# Patient Record
Sex: Female | Born: 1985 | Race: White | Hispanic: Yes | Marital: Married | Smoking: Never smoker
Health system: Southern US, Community
[De-identification: ages and names within clinical notes are randomized; demographics above are authoritative.]

## PROBLEM LIST (undated history)

## (undated) ENCOUNTER — Inpatient Hospital Stay (HOSPITAL_COMMUNITY): Payer: Self-pay

## (undated) DIAGNOSIS — J302 Other seasonal allergic rhinitis: Secondary | ICD-10-CM

## (undated) DIAGNOSIS — T7840XA Allergy, unspecified, initial encounter: Secondary | ICD-10-CM

## (undated) DIAGNOSIS — Z789 Other specified health status: Secondary | ICD-10-CM

## (undated) DIAGNOSIS — O24419 Gestational diabetes mellitus in pregnancy, unspecified control: Secondary | ICD-10-CM

## (undated) HISTORY — DX: Allergy, unspecified, initial encounter: T78.40XA

## (undated) HISTORY — PX: CHOLECYSTECTOMY: SHX55

## (undated) HISTORY — DX: Gestational diabetes mellitus in pregnancy, unspecified control: O24.419

---
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
## 2005-09-11 ENCOUNTER — Ambulatory Visit (HOSPITAL_COMMUNITY): Admission: RE | Admit: 2005-09-11 | Discharge: 2005-09-11 | Payer: Self-pay | Admitting: *Deleted

## 2005-09-12 ENCOUNTER — Inpatient Hospital Stay (HOSPITAL_COMMUNITY): Admission: AD | Admit: 2005-09-12 | Discharge: 2005-09-12 | Payer: Self-pay | Admitting: Family Medicine

## 2005-09-15 ENCOUNTER — Inpatient Hospital Stay (HOSPITAL_COMMUNITY): Admission: AD | Admit: 2005-09-15 | Discharge: 2005-09-15 | Payer: Self-pay | Admitting: Obstetrics & Gynecology

## 2005-09-17 ENCOUNTER — Inpatient Hospital Stay (HOSPITAL_COMMUNITY): Admission: AD | Admit: 2005-09-17 | Discharge: 2005-09-17 | Payer: Self-pay | Admitting: Obstetrics and Gynecology

## 2006-05-05 ENCOUNTER — Inpatient Hospital Stay (HOSPITAL_COMMUNITY): Admission: AD | Admit: 2006-05-05 | Discharge: 2006-05-07 | Payer: Self-pay | Admitting: Obstetrics

## 2009-08-24 ENCOUNTER — Emergency Department (HOSPITAL_COMMUNITY): Admission: EM | Admit: 2009-08-24 | Discharge: 2009-08-25 | Payer: Self-pay | Admitting: Emergency Medicine

## 2009-08-24 ENCOUNTER — Emergency Department (HOSPITAL_COMMUNITY): Admission: EM | Admit: 2009-08-24 | Discharge: 2009-08-24 | Payer: Self-pay | Admitting: Emergency Medicine

## 2009-12-25 ENCOUNTER — Inpatient Hospital Stay (HOSPITAL_COMMUNITY): Admission: AD | Admit: 2009-12-25 | Discharge: 2009-12-25 | Payer: Self-pay | Admitting: Obstetrics and Gynecology

## 2010-11-14 LAB — POCT PREGNANCY, URINE: Preg Test, Ur: NEGATIVE

## 2010-11-14 LAB — URINALYSIS, ROUTINE W REFLEX MICROSCOPIC
Hgb urine dipstick: NEGATIVE
Ketones, ur: NEGATIVE mg/dL
Nitrite: NEGATIVE
Protein, ur: NEGATIVE mg/dL
Specific Gravity, Urine: 1.025 (ref 1.005–1.030)

## 2010-11-14 LAB — CBC
HCT: 39.6 % (ref 36.0–46.0)
Hemoglobin: 13.6 g/dL (ref 12.0–15.0)
MCV: 91.6 fL (ref 78.0–100.0)

## 2011-01-12 NOTE — Op Note (Signed)
Karen Gamble, Karen Gamble                 ACCOUNT NO.:  [id number]   MEDICAL RECORD NO.:  [id number]                   PATIENT TYPE:  INP   LOCATION:  9169                                 FACILITY:  [facility]   PHYSICIAN:  Kathreen Cosier, M.D.           DATE OF BIRTH:  [birth year]   DATE OF PROCEDURE:  08/13/2003  DATE OF DISCHARGE:                                 OPERATIVE REPORT   The patient is a 25 year old primigravida, [facility] August 08, 2003, was  admitted in labor.  She became fully dilated and pushed up +3 station and  had variables with each contraction.  Eventually she had a prolonged  deceleration, but the fetal heart stayed at 90, and she at +3 station.  A  midline episiotomy which was cut to a third-degree and the vacuum applied.  The vacuum was applied through two contractions.  There was no pop-off, and  there was release of pressure in between the contractions.  She delivered  from the LOA position of a female, Apgars 7 and 8.  There was a tight, short  nuchal cord which was cut prior to delivery.  The placenta was spontaneous.  The third-degree extended to a small fourth-degree, and the rectum was  repaired in two layers, and the anal sphincter appeared, and the episiotomy  repaired in the usual manner.  Postrepair, the rectum and anal sphincter  were intact.  The patient tolerated the procedure well.                                               Kathreen Cosier, M.D.    BAM/MEDQ  D:  08/13/2003  T:  08/15/2003  Job:  [id number]

## 2011-04-06 ENCOUNTER — Inpatient Hospital Stay (HOSPITAL_COMMUNITY)
Admission: AD | Admit: 2011-04-06 | Discharge: 2011-04-06 | Disposition: A | Discharge status: Home / Self Care | Payer: Self-pay | Source: Ambulatory Visit | Attending: Obstetrics | Admitting: Obstetrics

## 2011-04-06 ENCOUNTER — Encounter (HOSPITAL_COMMUNITY): Payer: Self-pay | Admitting: *Deleted

## 2011-04-06 DIAGNOSIS — N946 Dysmenorrhea, unspecified: Secondary | ICD-10-CM

## 2011-04-06 HISTORY — DX: Other specified health status: Z78.9

## 2011-04-06 LAB — CBC
HCT: 41 % (ref 36.0–46.0)
Hemoglobin: 13.9 g/dL (ref 12.0–15.0)
MCH: 30.9 pg (ref 26.0–34.0)
MCHC: 33.9 g/dL (ref 30.0–36.0)
MCV: 91.1 fL (ref 78.0–100.0)
RDW: 12.9 % (ref 11.5–15.5)
WBC: 7.1 10*3/uL (ref 4.0–10.5)

## 2011-04-06 LAB — URINALYSIS, ROUTINE W REFLEX MICROSCOPIC
Bilirubin Urine: NEGATIVE
Glucose, UA: NEGATIVE mg/dL
Protein, ur: NEGATIVE mg/dL
Specific Gravity, Urine: 1.01 (ref 1.005–1.030)
Urobilinogen, UA: 0.2 mg/dL (ref 0.0–1.0)
pH: 5.5 (ref 5.0–8.0)

## 2011-04-06 LAB — WET PREP, GENITAL: Yeast Wet Prep HPF POC: NONE SEEN

## 2011-04-06 LAB — URINE MICROSCOPIC-ADD ON

## 2011-04-06 MED ORDER — IBUPROFEN 800 MG PO TABS: 800.0000 mg | ORAL_TABLET | Freq: Three times a day (TID) | ORAL | Status: AC

## 2011-04-06 NOTE — Progress Notes (Signed)
E.Rice discussed POC. Pt verbalized understanding. Interputor present

## 2011-04-06 NOTE — Progress Notes (Signed)
04/06/2011 Karen Gamble  Interpreter  I assisted Olegario Messier RN and [place] PA  with questions and explanation of plan of care.

## 2011-04-06 NOTE — Progress Notes (Signed)
RLQ pain x 3 days, both pos and neg home UPT Some frequency of urination, no pain

## 2011-04-06 NOTE — ED Provider Notes (Addendum)
History   Pt presents today c/o lower abd pain for the past 3 days. She states she has been trying to get pregnant. She is also c/o vag bleeding.Her LMP was last month around the 7th. She denies fever or any other sx at this time. The interpreter is present.  Chief Complaint  Patient presents with  . Possible Pregnancy  . Vaginal Bleeding   HPI  OB History    Grav Para Term Preterm Abortions TAB SAB Ect Mult Living   2 2 2  0 0 0 0 0 0 2      Past Medical History  Diagnosis Date  . No pertinent past medical history     Past Surgical History  Procedure Date  . Cholecystectomy     No family history on file.  History  Substance Use Topics  . Smoking status: Never Smoker   . Smokeless tobacco: Not on file  . Alcohol Use: No    Allergies: No Known Allergies  No prescriptions prior to admission    Review of Systems  Constitutional: Negative for fever.  Cardiovascular: Negative for chest pain.  Gastrointestinal: Positive for abdominal pain. Negative for nausea, vomiting, diarrhea and constipation.  Genitourinary: Negative for dysuria, urgency, frequency and hematuria.  Neurological: Negative for dizziness and headaches.  Psychiatric/Behavioral: Negative for depression and suicidal ideas.   Physical Exam   Blood pressure 121/77, pulse 61, temperature 98.2 F (36.8 C), resp. rate 18, height 6' (1.829 m), last menstrual period 03/03/2011.  Physical Exam  Constitutional: She is oriented to person, place, and time. She appears well-developed and well-nourished. No distress.  HENT:  Head: Normocephalic and atraumatic.  Eyes: EOM are normal. Pupils are equal, round, and reactive to light.  GI: Soft. She exhibits no distension. There is tenderness. There is no rebound and no guarding.  Genitourinary: There is bleeding around the vagina. No vaginal discharge found.       Uterus NL size and shape. No adnexal masses.  Neurological: She is alert and oriented to person, place,  and time.  Skin: Skin is warm and dry. She is not diaphoretic.  Psychiatric: She has a normal mood and affect. Her behavior is normal. Judgment and thought content normal.    MAU Course  Procedures  Results for orders placed during the hospital encounter of 04/06/11 (from the past 24 hour(s))  URINALYSIS, ROUTINE W REFLEX MICROSCOPIC     Status: Abnormal   Collection Time   04/06/11 12:50 PM      Component Value Range   Color, Urine YELLOW  YELLOW    Appearance HAZY (*) CLEAR    Specific Gravity, Urine 1.010  1.005 - 1.030    pH 5.5  5.0 - 8.0    Glucose, UA NEGATIVE  NEGATIVE (mg/dL)   Hgb urine dipstick LARGE (*) NEGATIVE    Bilirubin Urine NEGATIVE  NEGATIVE    Ketones, ur NEGATIVE  NEGATIVE (mg/dL)   Protein, ur NEGATIVE  NEGATIVE (mg/dL)   Urobilinogen, UA 0.2  0.0 - 1.0 (mg/dL)   Nitrite NEGATIVE  NEGATIVE    Leukocytes, UA NEGATIVE  NEGATIVE   URINE MICROSCOPIC-ADD ON     Status: Abnormal   Collection Time   04/06/11 12:50 PM      Component Value Range   Squamous Epithelial / LPF FEW (*) RARE    RBC / HPF TOO NUMEROUS TO COUNT  <3 (RBC/hpf)  POCT PREGNANCY, URINE     Status: Normal   Collection Time   04/06/11  1:26 PM      Component Value Range   Preg Test, Ur NEGATIVE    CBC     Status: Normal   Collection Time   04/06/11  2:43 PM      Component Value Range   WBC 7.1  4.0 - 10.5 (K/uL)   RBC 4.50  3.87 - 5.11 (MIL/uL)   Hemoglobin 13.9  12.0 - 15.0 (g/dL)   HCT 16.1  09.6 - 04.5 (%)   MCV 91.1  78.0 - 100.0 (fL)   MCH 30.9  26.0 - 34.0 (pg)   MCHC 33.9  30.0 - 36.0 (g/dL)   RDW 40.9  81.1 - 91.4 (%)   Platelets 250  150 - 400 (K/uL)  WET PREP, GENITAL     Status: Abnormal   Collection Time   04/06/11  2:44 PM      Component Value Range   Yeast, Wet Prep NONE SEEN  NONE SEEN    Trich, Wet Prep NONE SEEN  NONE SEEN    Clue Cells, Wet Prep NONE SEEN  NONE SEEN    WBC, Wet Prep HPF POC FEW (*) NONE SEEN      Assessment and Plan  Abd pain: discussed with pt  at length via interpreter. Pt is having her menses. Will give Rx for ibuprofen. She will f/u with her PCP. Discussed diet, activity, risks, and precautions.  Clinton Gallant. Kvon Mcilhenny III, DrHSc, MPAS, PA-C  04/06/2011, 2:41 PM   Henrietta Hoover, PA 04/06/11 [phone]

## 2011-04-06 NOTE — Progress Notes (Signed)
D/c instructions given. iterperter present

## 2011-04-06 NOTE — Progress Notes (Signed)
E.RIce,PA at bedside. Small amount of bleeding noted(period)

## 2011-04-06 NOTE — Progress Notes (Signed)
04/06/2011 Ziza Hastings  Interpreter  I assisted Nurse, learning disability in Triage.

## 2011-07-18 ENCOUNTER — Ambulatory Visit (INDEPENDENT_AMBULATORY_CARE_PROVIDER_SITE_OTHER): Payer: Self-pay | Admitting: Obstetrics and Gynecology

## 2011-07-18 DIAGNOSIS — O0990 Supervision of high risk pregnancy, unspecified, unspecified trimester: Secondary | ICD-10-CM

## 2011-07-18 DIAGNOSIS — O219 Vomiting of pregnancy, unspecified: Secondary | ICD-10-CM

## 2011-07-18 DIAGNOSIS — H269 Unspecified cataract: Secondary | ICD-10-CM | POA: Insufficient documentation

## 2011-07-18 LAB — DIFFERENTIAL
Lymphs Abs: 1.9 10*3/uL (ref 0.7–4.0)
Monocytes Relative: 4 % (ref 3–12)
Neutro Abs: 5 10*3/uL (ref 1.7–7.7)
Neutrophils Relative %: 67 % (ref 43–77)

## 2011-07-18 LAB — RUBELLA SCREEN: Rubella: 173.5 IU/mL — ABNORMAL HIGH

## 2011-07-18 LAB — HIV ANTIBODY (ROUTINE TESTING W REFLEX): HIV: NONREACTIVE

## 2011-07-18 LAB — CBC
MCH: 31.4 pg (ref 26.0–34.0)
MCHC: 34.8 g/dL (ref 30.0–36.0)
Platelets: 254 10*3/uL (ref 150–400)
WBC: 7.4 10*3/uL (ref 4.0–10.5)

## 2011-07-18 LAB — POCT URINALYSIS DIP (DEVICE)
Bilirubin Urine: NEGATIVE
Glucose, UA: NEGATIVE mg/dL
Hgb urine dipstick: NEGATIVE
Ketones, ur: NEGATIVE mg/dL
Nitrite: NEGATIVE

## 2011-07-18 LAB — HEPATITIS B SURFACE ANTIGEN: Hepatitis B Surface Ag: NEGATIVE

## 2011-07-18 MED ORDER — ONDANSETRON 4 MG PO TBDP: 4.0000 mg | ORAL_TABLET | Freq: Three times a day (TID) | ORAL | Status: AC | PRN

## 2011-07-18 NOTE — Progress Notes (Signed)
Pain: hip Pressure: none

## 2011-07-18 NOTE — Progress Notes (Signed)
Saw pt with PA student and agree.

## 2011-07-18 NOTE — Patient Instructions (Addendum)
Embarazo - Primer trimestre (Pregnancy - First Trimester) Durante el acto sexual, millones de espermatozoides ingresan a la vagina. Slo Education administrator y Economist en vulo femenino mientras se encuentre en las Trompas de [place]. Una semana despus, el huevo fertilizado se implanta en la pared del tero. Un embrin comienza a desarrollarse y se convierte en un beb. Entre la 6 y la 8 semanas se forman los ojos y el rostro y el latido cardaco se detecta con Nurse, adult. Hacia el final de la 12 semana (primer trimestre) se han formado todos los rganos del beb. Ahora que est embarazada, desear hacer todo lo posible para tener un beb sano. Dos de las cosas ms importantes son: Winferd Humphrey buena atencin prenatal y seguir las indicaciones del profesional que la asiste. La atencin prenatal incluye toda la asistencia mdica que usted recibe antes del nacimiento del beb. Se lleva a cabo para prevenir problemas durante el embarazo y [place]. EXAMENES PRENATALES  Durante los controles prenatales, le tomarn la presin arterial, verificarn su peso y [place] harn [place] de [place]. Todo esto se realiza para asegurar que usted progresa de [place] normal y Office manager.   Una mujer embarazada ganar de 25 a 35 libras (8 a 12 kilos) Academic librarian. Sin embargo, si tiene sobrepeso o bajo peso el mdico le aconsejar acerca de [place].   Entre los [employer] solicitarn anlisis de [place], cultivos de cuello de [place], [place] y [place]. Estas pruebas se realizan para controlar su salud y la del beb. Se recomienda realizar todos los [employer] y tambin pruebas para la deteccin del VIH, si usted lo autoriza. Este es el virus que causa el [place]. Estas pruebas se realizan porque existen medicamentos que podrn administrarle para prevenir que el beb nazca con esta infeccin, si usted estuviera infectada y no lo supiera. Tambin se solicitan anlisis de sangre para conocer su  grupo sanguneo, infecciones previas y para Chief Operating Officer los glbulos rojos (hemoglobina).   Un bajo nivel de hemoglobina (anemia) es frecuente durante el embarazo. Para prevenirla, se administran hierro y vitaminas. En etapas posteriores del [employer] solicitarn anlisis de sangre para descartar diabetes y otros anlisis para Sales promotion account executive otros problemas. Los anlisis son necesarios para verificar que usted y el beb estn bien.   Necesitar otras pruebas que se realizan para asegurarse de que el beb est saludable.  CAMBIOS DURANTE EL PRIMER TRIMESTRE (LOS TRES PRIMEROS MESES DEL EMBARAZO). Su organismo atravesar numerosos cambios Academic librarian. Estos pueden variar de Neomia Dear persona a otra. Converse con el profesional que la asiste acerca los cambios que usted note y que la preocupen.  El perodo menstrual se detiene.   El vulo y los espermatozoides llevan los genes que determinan cmo seremos. Sus genes y los de su pareja forman un beb. Los genes del varn determinan si ser un nio o una nia.   No tome ningn medicamento a menos que se lo haya indicado el profesional que la asiste.   Aumentar la circunferencia de su cuerpo y se sentir hinchada.   Podr sentir nuseas o vmitos. Si los vmitos son incontrolables, comunquese con el profesional que la asiste.   Sus mamas comenzarn a agrandarse y estarn ms sensibles.   Los pezones salen hacia afuera y se oscurecen.   Mayor necesidad de Geographical information systems officer. Si siente dolor al orinar podra tener una infeccin urinaria.   Cansarse fcilmente.   Prdida del apetito.   Retorcijones por ciertos tipos de alimentos.  Al principio, podr ganar o perder algo de peso.   Podr sentir Warden/ranger (alegra por Firefighter o preocupacin acerca de que haya algo mal en el embarazo o con el beb).   Podr tener sueos ms vvidos y fuertes.  INSTRUCCIONES PARA EL CUIDADO DOMICILIARIO  Es extremadamente importante que evite el  cigarrillo, el alcohol y las drogas no prescriptas durante el Psychiatrist. Estas sustancias qumicas afectan la formacin y el desarrollo del beb. Evite estas sustancias qumicas durante todo el embarazo para asegurar el nacimiento de un beb sano.   Comience las consultas prenatales alrededor de la 12 semana de [place]. Generalmente se programan cada mes al principio y se hacen ms frecuentes en los 2 ltimos meses antes del parto. Cumpla con las citas tal como se le indic. Siga las indicaciones del profesional con respecto a los medicamentos que toma, los anlisis de laboratorio, la actividad fsica y Psychologist, forensic.   Durante el embarazo debe obtener nutrientes para usted y para su beb. Consuma alimentos balanceados. Elija alimentos como carne, pescado, [place] y otros productos lcteos descremados, vegetales, frutas, panes integrales y cereales. El Equities trader cul es el aumento de peso ideal.   Las nuseas matinales pueden aliviarse si come algunas galletitas saladas en la cama. Coma dos galletitas antes de levantarse por la maana. Tambin puede comer galletitas sin sal.   Tome 4  5 comidas pequeas por Geophysical data processor de 3 comidas abundantes para evitar las nuseas y los vmitos.   Tambin puede prevenir las nuseas bebiendo lquidos entre comidas en lugar de [place] come.   Si no tiene problemas, puede continuar [employer] a lo largo de todo Firefighter. Algunos problemas pueden ser la prdida prematura de lquido amnitico de las [place], hemorragias vaginales o dolor en el abdomen.   Realice [employer], si no tiene restricciones. Consulte con el profesional que la asiste o con un fisioterapeuta si no sabe con certeza si determinados ejercicios son seguros. En los dos ltimos trimestres del embarazo puede ocurrir un aumento de [place]. La actividad fsica ayudar a:   Engineering geologist.   Mantenerse en forma.   Prepararse para  el parto y [place] de [place].   Ayuda a perder el peso ganado en el embarazo luego del [place].   Use un buen sostn o como los que se usan para hacer deportes para Paramedic la sensibilidad de las [place]. Tambin puede serle til si lo [place] mientras duerme.   Consulte cuando tendr el curso preparatorio para Engineer, manufacturing systems. Comience las clases cuando se las ofrezcan.   No utilice la baera con agua caliente, baos turcos y saunas.   Colquese el cinturn de seguridad cuando conduzca. Este la proteger a usted y al beb en caso de accidente.   Evite comer carne cruda y el contacto con los utensilios y desperdicios de los gatos. Estos elementos contienen grmenes que pueden causar defectos de nacimiento en el beb.   El primer trimestre es un buen momento para visitar a su dentista y Software engineer. Es importante mantener los dientes limpios. Use un cepillo de dientes suave y cepllese con ms suavidad durante el [employer].   Pida ayuda si tiene necesidades econmicas, de asesoramiento o nutricionales durante el [place]. El profesional podr ayudarla con respecto a estas necesidades, o derivarla a otros especialistas.   No tome ningn medicamento a menos que se lo haya indicado el profesional que la  asiste.   Informe al profesional que lo asiste si es vctima de algn tipo de Advertising copywriter, ya sea mental o fsica.   Haga una lista de nmeros de telfono de Associate Professor de familiares, amigos, hospital, polica y bomberos.   Anote sus preguntas. Llvelas con usted en su prxima visita de control.   No utilice duchas vaginales.   No cruce las piernas.   Si ha estado parada durante largos perodos de [place], mueva los pies o realice pequeos pasos en crculo.   Podr tener secreciones vaginales que pueden requerir una compresa o [place]. No utilice tampones ni compresas con perfume.  EL CONSUMO DE MEDICAMENTOS Y DROGAS DURANTE EL EMBARAZO  Tome las vitaminas apropiadas para esta  etapa tal como se le indic. Las vitaminas deben contener 1 miligramo de cido flico. Guarde todas las vitaminas fuera del alcance de los nios. La ingestin de slo un par de vitaminas o tabletas que contengan hierro pueden ocasionar la [employer] en un beb o en un nio pequeo.   Evite el uso de medicamentos, inclusive hierbas, que no hayan sido prescriptos o indicados por el profesional que la asiste. Utilice los medicamentos de venta libre o de prescripcin para Chief Technology Officer, Environmental health practitioner o la [place], segn se lo indique el profesional que lo asiste. No utilice aspirina, ibuprofeno o naproxeno a menos que el profesional la autorice.   El consumo de alcohol se relaciona con ciertos defectos de nacimiento. Entre ellos el sndrome de alcoholismo fetal. Debe evitar el consumo de alcohol en cualquiera de sus formas. El cigarrillo causa nacimientos prematuros y bebs de [place].   Las drogas ocasionan terribles daos al beb. Estn absolutamente prohibidas. Un beb que nace de [employer], ser adicto al nacer. Ese beb tendr los mismos sntomas de abstinencia que un adulto.   No consuma drogas. Pueden daar gravemente al beb.   Converse con el mdico acerca de cualquier medicamento que est tomando y la razn por la cual los toma.  EL ABORTO ESPONTNEO ES UNA SITUACIN FRECUENTE Esto no significa que ha hecho las cosas mal. No es un motivo para preocuparse en caso de un nuevo embarazo. El profesional que la asiste la ayudar si tiene preguntas para formular. Si ha sufrido un aborto espontneo, Pension scheme manager someterse a Futures trader de curetaje, SOLICITE ATENCIN MDICA SI: Tiene alguna preocupacin durante el embarazo. Es mejor que llame para formular las preguntas si no puede esperar hasta la prxima visita, que sentirse preocupada por ellas. SOLICITE ATENCIN MDICA DE INMEDIATO SI:  La temperatura oral se eleva sin motivo por encima de 102 F (38.9 C) o segn le indique el profesional que lo  asiste.   Tiene una prdida de lquido por la vagina (canal de parto). Si sospecha una ruptura de las [place], tmese la temperatura y llame al profesional para informarlo sobre esto.   Observa unas pequeas manchas o una hemorragia vaginal. Notifique al profesional acerca de la cantidad y de cuntos apsitos est utilizando.   Presenta un olor desagradable en la secrecin vaginal y observa un cambio en el color.   Contina con las nuseas y no obtiene alivio de los remedios indicados. Vomita sangre o algo similar a la borra del caf.   Pierde ms de 1 Kg de peso en [employer].   Aumenta ms de 1 Kg en una semana y [address], las manos, los pies o las piernas hinchados.   Aumenta ms de 2,5 Kg en una semana (aunque  no tenga las manos, pies, piernas o el rostro hinchados).   Ha estado expuesta a la rubola y no ha sufrido la enfermedad. Ha estado expuesta a la quinta enfermedad o a la varicela.   Ha estado expuesta a la quinta enfermedad o a la varicela.   Presenta dolor abdominal. Las molestias en el ligamento redondo son Neomia Dear causa benigna (no cancerosa) frecuente de dolor abdominal durante el embarazo. El profesional que la asiste deber evaluarla.   Presenta dolor de Renne Musca, diarrea, dolor al orinar o le falta la respiracin.   Sufre una cada, un accidente automovilstico o algn traumatismo.   Sufre violencia fsica o mental en su hogar.  Document Released: 05/23/2005 Document Revised: 04/25/2011 [facility] Patient Information 2012 [place], [name]. Embarazo - Primer trimestre (Pregnancy - First Trimester) Durante el acto sexual, millones de espermatozoides ingresan a la vagina. Slo Education administrator y Economist en vulo femenino mientras se encuentre en las Trompas de [place]. Una semana despus, el huevo fertilizado se implanta en la pared del tero. Un embrin comienza a desarrollarse y se convierte en un beb. Entre la 6 y la 8 semanas se forman los ojos y el rostro  y el latido cardaco se detecta con Nurse, adult. Hacia el final de la 12 semana (primer trimestre) se han formado todos los rganos del beb. Ahora que est embarazada, desear hacer todo lo posible para tener un beb sano. Dos de las cosas ms importantes son: Winferd Humphrey buena atencin prenatal y seguir las indicaciones del profesional que la asiste. La atencin prenatal incluye toda la asistencia mdica que usted recibe antes del nacimiento del beb. Se lleva a cabo para prevenir problemas durante el embarazo y [employer]. EXAMENES PRENATALES  Durante los controles prenatales, le tomarn la presin arterial, verificarn su peso y [place] harn [place] de [place]. Todo esto se realiza para asegurar que usted progresa de [place] normal y Office manager.   Una mujer embarazada ganar de 25 a 35 libras (8 a 12 kilos) Academic librarian. Sin embargo, si tiene sobrepeso o bajo peso el mdico le aconsejar acerca de [place].   Entre los [employer] solicitarn anlisis de [place], cultivos de cuello de [place], [place] y [place]. Estas pruebas se realizan para controlar su salud y la del beb. Se recomienda realizar todos los [employer] y tambin pruebas para la deteccin del VIH, si usted lo autoriza. Este es el virus que causa el [place]. Estas pruebas se realizan porque existen medicamentos que podrn administrarle para prevenir que el beb nazca con esta infeccin, si usted estuviera infectada y no lo supiera. Tambin se solicitan anlisis de sangre para conocer su grupo sanguneo, infecciones previas y para Chief Operating Officer los glbulos rojos (hemoglobina).   Un bajo nivel de hemoglobina (anemia) es frecuente durante el embarazo. Para prevenirla, se administran hierro y vitaminas. En etapas posteriores del [employer] solicitarn anlisis de sangre para descartar diabetes y otros anlisis para Sales promotion account executive otros problemas. Los anlisis son necesarios para verificar que usted y el beb estn  bien.   Necesitar otras pruebas que se realizan para asegurarse de que el beb est saludable.  CAMBIOS DURANTE EL PRIMER TRIMESTRE (LOS TRES PRIMEROS MESES DEL EMBARAZO). Su organismo atravesar numerosos cambios Academic librarian. Estos pueden variar de Neomia Dear persona a otra. Converse con el profesional que la asiste acerca los cambios que usted note y que la preocupen.  El perodo menstrual se detiene.   El vulo y los espermatozoides llevan los  genes que determinan cmo seremos. Sus genes y los de su pareja forman un beb. Los genes del varn determinan si ser un nio o una nia.   No tome ningn medicamento a menos que se lo haya indicado el profesional que la asiste.   Aumentar la circunferencia de su cuerpo y se sentir hinchada.   Podr sentir nuseas o vmitos. Si los vmitos son incontrolables, comunquese con el profesional que la asiste.   Sus mamas comenzarn a agrandarse y estarn ms sensibles.   Los pezones salen hacia afuera y se oscurecen.   Mayor necesidad de Geographical information systems officer. Si siente dolor al orinar podra tener una infeccin urinaria.   Cansarse fcilmente.   Prdida del apetito.   Retorcijones por ciertos tipos de alimentos.   Al principio, podr ganar o perder algo de peso.   Podr sentir Warden/ranger (alegra por Firefighter o preocupacin acerca de que haya algo mal en el embarazo o con el beb).   Podr tener sueos ms vvidos y fuertes.  INSTRUCCIONES PARA EL CUIDADO DOMICILIARIO  Es extremadamente importante que evite el cigarrillo, el alcohol y las drogas no prescriptas durante el Psychiatrist. Estas sustancias qumicas afectan la formacin y el desarrollo del beb. Evite estas sustancias qumicas durante todo el embarazo para asegurar el nacimiento de un beb sano.   Comience las consultas prenatales alrededor de la 12 semana de [place]. Generalmente se programan cada mes al principio y se hacen ms frecuentes en los 2 ltimos meses antes  del parto. Cumpla con las citas tal como se le indic. Siga las indicaciones del profesional con respecto a los medicamentos que toma, los anlisis de laboratorio, la actividad fsica y Psychologist, forensic.   Durante el embarazo debe obtener nutrientes para usted y para su beb. Consuma alimentos balanceados. Elija alimentos como carne, pescado, [place] y otros productos lcteos descremados, vegetales, frutas, panes integrales y cereales. El Equities trader cul es el aumento de peso ideal.   Las nuseas matinales pueden aliviarse si come algunas galletitas saladas en la cama. Coma dos galletitas antes de levantarse por la maana. Tambin puede comer galletitas sin sal.   Tome 4  5 comidas pequeas por Geophysical data processor de 3 comidas abundantes para evitar las nuseas y los vmitos.   Tambin puede prevenir las nuseas bebiendo lquidos entre comidas en lugar de [place] come.   Si no tiene problemas, puede continuar [employer] a lo largo de todo Firefighter. Algunos problemas pueden ser la prdida prematura de lquido amnitico de las [place], hemorragias vaginales o dolor en el abdomen.   Realice [employer], si no tiene restricciones. Consulte con el profesional que la asiste o con un fisioterapeuta si no sabe con certeza si determinados ejercicios son seguros. En los dos ltimos trimestres del embarazo puede ocurrir un aumento de [place]. La actividad fsica ayudar a:   Engineering geologist.   Mantenerse en forma.   Prepararse para el parto y [place] de [place].   Ayuda a perder el peso ganado en el embarazo luego del [place].   Use un buen sostn o como los que se usan para hacer deportes para Paramedic la sensibilidad de las [place]. Tambin puede serle til si lo [place] mientras duerme.   Consulte cuando tendr el curso preparatorio para Engineer, manufacturing systems. Comience las clases cuando se las ofrezcan.   No utilice la baera con agua caliente, baos turcos y  saunas.   Colquese el  cinturn de seguridad cuando conduzca. Este la proteger a usted y al beb en caso de accidente.   Evite comer carne cruda y el contacto con los utensilios y desperdicios de los gatos. Estos elementos contienen grmenes que pueden causar defectos de nacimiento en el beb.   El primer trimestre es un buen momento para visitar a su dentista y Software engineer. Es importante mantener los dientes limpios. Use un cepillo de dientes suave y cepllese con ms suavidad durante el [employer].   Pida ayuda si tiene necesidades econmicas, de asesoramiento o nutricionales durante el [place]. El profesional podr ayudarla con respecto a estas necesidades, o derivarla a otros especialistas.   No tome ningn medicamento a menos que se lo haya indicado el profesional que la asiste.   Informe al profesional que lo asiste si es vctima de algn tipo de Advertising copywriter, ya sea mental o fsica.   Haga una lista de nmeros de telfono de Associate Professor de familiares, amigos, hospital, polica y bomberos.   Anote sus preguntas. Llvelas con usted en su prxima visita de control.   No utilice duchas vaginales.   No cruce las piernas.   Si ha estado parada durante largos perodos de [place], mueva los pies o realice pequeos pasos en crculo.   Podr tener secreciones vaginales que pueden requerir una compresa o [place]. No utilice tampones ni compresas con perfume.  EL CONSUMO DE MEDICAMENTOS Y DROGAS DURANTE EL EMBARAZO  Tome las vitaminas apropiadas para esta etapa tal como se le indic. Las vitaminas deben contener 1 miligramo de cido flico. Guarde todas las vitaminas fuera del alcance de los nios. La ingestin de slo un par de vitaminas o tabletas que contengan hierro pueden ocasionar la [employer] en un beb o en un nio pequeo.   Evite el uso de medicamentos, inclusive hierbas, que no hayan sido prescriptos o indicados por el profesional que la asiste. Utilice los  medicamentos de venta libre o de prescripcin para Chief Technology Officer, Environmental health practitioner o la [place], segn se lo indique el profesional que lo asiste. No utilice aspirina, ibuprofeno o naproxeno a menos que el profesional la autorice.   El consumo de alcohol se relaciona con ciertos defectos de nacimiento. Entre ellos el sndrome de alcoholismo fetal. Debe evitar el consumo de alcohol en cualquiera de sus formas. El cigarrillo causa nacimientos prematuros y bebs de [place].   Las drogas ocasionan terribles daos al beb. Estn absolutamente prohibidas. Un beb que nace de [employer], ser adicto al nacer. Ese beb tendr los mismos sntomas de abstinencia que un adulto.   No consuma drogas. Pueden daar gravemente al beb.   Converse con el mdico acerca de cualquier medicamento que est tomando y la razn por la cual los toma.  EL ABORTO ESPONTNEO ES UNA SITUACIN FRECUENTE Esto no significa que ha hecho las cosas mal. No es un motivo para preocuparse en caso de un nuevo embarazo. El profesional que la asiste la ayudar si tiene preguntas para formular. Si ha sufrido un aborto espontneo, Pension scheme manager someterse a Futures trader de curetaje, SOLICITE ATENCIN MDICA SI: Tiene alguna preocupacin durante el embarazo. Es mejor que llame para formular las preguntas si no puede esperar hasta la prxima visita, que sentirse preocupada por ellas. SOLICITE ATENCIN MDICA DE INMEDIATO SI:  La temperatura oral se eleva sin motivo por encima de 102 F (38.9 C) o segn le indique el profesional que lo asiste.   Tiene una prdida de lquido por la  vagina (canal de parto). Si sospecha una ruptura de las [place], tmese la temperatura y llame al profesional para informarlo sobre esto.   Observa unas pequeas manchas o una hemorragia vaginal. Notifique al profesional acerca de la cantidad y de cuntos apsitos est utilizando.   Presenta un olor desagradable en la secrecin vaginal y observa un cambio en el  color.   Contina con las nuseas y no obtiene alivio de los remedios indicados. Vomita sangre o algo similar a la borra del caf.   Pierde ms de 1 Kg de peso en [employer].   Aumenta ms de 1 Kg en una semana y [address], las manos, los pies o las piernas hinchados.   Aumenta ms de 2,5 Kg en una semana (aunque no tenga las manos, pies, piernas o el rostro hinchados).   Ha estado expuesta a la rubola y no ha sufrido la enfermedad. Ha estado expuesta a la quinta enfermedad o a la varicela.   Ha estado expuesta a la quinta enfermedad o a la varicela.   Presenta dolor abdominal. Las molestias en el ligamento redondo son Neomia Dear causa benigna (no cancerosa) frecuente de dolor abdominal durante el embarazo. El profesional que la asiste deber evaluarla.   Presenta dolor de Renne Musca, diarrea, dolor al orinar o le falta la respiracin.   Sufre una cada, un accidente automovilstico o algn traumatismo.   Sufre violencia fsica o mental en su hogar.  Document Released: 05/23/2005 Document Revised: 04/25/2011 [facility] Patient Information 2012 [place], [name].

## 2011-07-18 NOTE — Progress Notes (Signed)
° °  Subjective:    Karen Gamble is a [id number] [redacted]w[redacted]d being seen today for her first obstetrical visit.  Her obstetrical history is significant for previous pregnancy with GDM A1. Patient does intend to breast feed. Pregnancy history fully reviewed.  Patient reports nausea, vomiting and mild right lower abdominal pain. Some presence of small amounts of blood in vomit occasionally. Experiences lightheadedness during and after episodes of vomiting.  Filed Vitals:   07/18/11 0846 07/18/11 0939  BP: 121/85   Pulse: 75   Temp: 97.3 F (36.3 C)   Height:  4\' 11"  (1.499 m)  Weight: 127 lb (57.607 kg)     HISTORY: OB History    Grav Para Term Preterm Abortions TAB SAB Ect Mult Living   3 2 2  0 0 0 0 0 0 2     # Outc Date GA Lbr Len/2nd Wgt Sex Del Anes PTL Lv   1 TRM 12/04 [redacted]w[redacted]d  6lb1.8oz(2.771kg) M SVD EPI  Yes   2 TRM 9/07 [redacted]w[redacted]d  6lb14.4oz(3.13kg) M SVD None  Yes   Comments: Gestational Diabetes   3 CUR              Past Medical History  Diagnosis Date   No pertinent past medical history    Gestational diabetes     2nd pregnancy   Past Surgical History  Procedure Date   Cholecystectomy    History reviewed. No pertinent family history.   Exam    Uterine Size: size equals dates  Pelvic Exam:    Perineum: Normal Perineum   Vulva: normal   Vagina:  normal mucosa   pH: normal   Cervix: no bleeding following Pap, no cervical motion tenderness and no lesions   Adnexa: normal adnexa   Bony Pelvis: average  System: Breast:  normal appearance, no masses or tenderness   Skin: normal coloration and turgor, no rashes    Neurologic: oriented, normal, normal mood, oriented   Extremities: normal strength, tone, and muscle mass   HEENT PERRLA, extra ocular movement intact and neck supple with midline trachea   Mouth/Teeth mucous membranes moist, pharynx normal without lesions   Neck supple   Cardiovascular: regular rate and rhythm, no murmurs or gallops    Respiratory:  appears well, vitals normal, no respiratory distress, acyanotic, normal RR, ear and throat exam is normal, neck free of mass or lymphadenopathy, chest clear, no wheezing, crepitations, rhonchi, normal symmetric air entry   Abdomen: soft, non-tender; bowel sounds normal; no masses,  no organomegaly and mild tenderness with palpation to the lower right quadrant   Urinary: urethral meatus normal      Assessment:    Pregnancy: [id number] Patient Active Problem List  Diagnoses   Cataract of left eye        Plan:    Discussed with patient pre-natal care plan. Will contact patient for any abnormal labs. Patient was instructed to drink water and was given anti-nausea medication. Patient is to return to clinic in 4 weeks or sooner if needed. Initial labs drawn. Prenatal vitamins. Problem list reviewed and updated. Genetic Screening discussed First Screen: declined.  Ultrasound discussed; fetal survey: undecided.  Follow up in 4 weeks. 40% of 40 min visit spent on counseling and coordination of care.     Mosetta Putt, PA-S 07/18/2011

## 2011-07-19 LAB — GC/CHLAMYDIA PROBE AMP, GENITAL: Chlamydia, DNA Probe: NEGATIVE

## 2011-07-19 LAB — ABO AND RH: Rh Type: POSITIVE

## 2011-07-23 LAB — HEMOGLOBINOPATHY EVALUATION
Hemoglobin Other: 0 %
Hgb A: 97.2 % (ref 96.8–97.8)

## 2011-07-27 ENCOUNTER — Other Ambulatory Visit: Payer: Self-pay

## 2011-08-15 ENCOUNTER — Ambulatory Visit (INDEPENDENT_AMBULATORY_CARE_PROVIDER_SITE_OTHER): Payer: Self-pay | Admitting: Physician Assistant

## 2011-08-15 ENCOUNTER — Other Ambulatory Visit: Payer: Self-pay | Admitting: Obstetrics and Gynecology

## 2011-08-15 DIAGNOSIS — Z331 Pregnant state, incidental: Secondary | ICD-10-CM

## 2011-08-15 DIAGNOSIS — Z23 Encounter for immunization: Secondary | ICD-10-CM

## 2011-08-15 LAB — POCT URINALYSIS DIP (DEVICE)
Bilirubin Urine: NEGATIVE
Hgb urine dipstick: NEGATIVE
Ketones, ur: NEGATIVE mg/dL
Nitrite: NEGATIVE
Protein, ur: NEGATIVE mg/dL
pH: 7 (ref 5.0–8.0)

## 2011-08-15 MED ORDER — INFLUENZA VIRUS VACC SPLIT PF IM SUSP
0.5000 mL | INTRAMUSCULAR | Status: AC
  Administered 2011-08-15: 0.5 mL via INTRAMUSCULAR

## 2011-08-15 NOTE — Progress Notes (Signed)
Pulse 78. Pt signed consent for flu vaccine.

## 2011-08-15 NOTE — Progress Notes (Signed)
No complaints. Occasional nausea and vomiting. Abnormal 1 hour glucola. Will schedule 3 GTT. FLu vaccine today

## 2011-08-15 NOTE — Patient Instructions (Signed)
Diabetes mellitus gestacional (Gestational Diabetes Mellitus) La diabetes mellitus gestacional se produce slo durante el embarazo. Aparece cuando el organismo no puede controlar adecuadamente la glucosa (azcar) que aumenta en la sangre despus de comer. Durante el [place], se produce una resistencia a la insulina (sensibilidad reducida a la insulina) debido a la liberacin de hormonas por parte de la placenta. Generalmente, el pncreas de una mujer embarazada produce la cantidad suficiente de insulina para vencer esa resistencia. Sin embargo, en la diabetes gestacional, hay insulina pero no cumple su funcin adecuadamente. Si la resistencia es lo suficientemente grave como para que el pncreas no produzca la cantidad de insulina suficiente, la glucosa extra se acumula en la sangre.  Devota Pace RIESGO DE DESARROLLAR DIABETES GESTACIONAL?  Las mujeres con historia de diabetes en la familia.   Las mujeres de ms de [address]   Las que presentan sobrepeso.   Las [employer] pertenecen a ciertos grupos tnicos (latinas, afroamericanas, norteamericanas nativas, asiticas y las originarias de las islas del Pacfico.  QUE PUEDE OCURRIRLE AL BEB? Si el nivel de glucosa en sangre de la madre es demasiado elevado mientras este [place], el nivel extra de azcar pasar por el cordn umbilical hacia el beb. Algunos de los problemas del beb pueden ser:  Beb demasiado grande: si el nio recibe Chief Strategy Officer, puede aumentar mucho de [place]. Esto puede hacer que sea demasiado grande para nacer por parto normal (vaginal) por lo que ser necesario realizar una cesrea.   Bajo nivel de glucosa (hipoglucemia): el beb produce insulina extra en respuesta a la excesiva cantidad de azcar que obtiene de [employer]. Cuando el beb nace y ya no necesita insulina extra, su nivel de azcar en sangre puede disminuir.   Ictericia (coloracin amarillenta de la piel y los ojos): esto es bastante frecuente en los  bebs. La causa es la acumulacin de una sustancia qumica denominada bilirrubina. No siempre es un trastorno grave, pero se observa con frecuencia en los bebs cuyas madres sufren diabetes gestacional.  RIESGOS PARA LA MADRE Las mujeres que han sufrido diabetes gestacional pueden tener ms riesgos para algunos problemas como:  Preeclampsia o toxemia, incluyendo problemas con hipertensin arterial. La presin arterial y los niveles de protenas en la orina deben controlarse con frecuencia.   Infecciones   Parto por cesrea.   Aparicin de diabetes tipo 2 en una etapa posterior de la vida. Alrededor del 30% al 50% sufrir diabetes posteriormente, especialmente las que son obesas.  DIAGNSTICO Las hormonas que causan resistencia a la insulina tienen su mayor nivel alrededor de las 24 a 28 semanas del Psychiatrist. Si se experimentan sntomas, stos son similares a los sntomas que normalmente aparecen durante el embarazo.  La diabetes mellitus gestacional generalmente se diagnostica por medio de un mtodo en dos partes: 1. Despus de la 24 a 28 semanas de Psychiatrist, la mujer debe beber una solucin que contiene glucosa y Education officer, environmental un anlisis de [place]. Si el nivel de glucosa es elevado, la realizarn un segundo [place].  2. La prueba oral de tolerancia a la glucosa, que dura aproximadamente tres horas. Despus de realizar ayuno durante la noche, se controla nivel de glucosa en sangre. La mujer bebe una solucin que contiene glucosa y Chief Executive Officer realizan anlisis de glucosa en sangre cada hora.  Si la mujer tiene factores de riesgos para la diabetes mellitus gestacional, el mdico podr Programme researcher, broadcasting/film/video anlisis antes de las 24 semanas de [place]. TRATAMIENTO El tratamiento est dirigido a Insurance underwriter en  sangre de Chief Strategy Officer en un nivel normal y puede incluir:  La planificacin de los alimentos.   Recibir insulina u otro medicamento para Sales executive nivel de glucosa en [place].   La prctica de ejercicios.    Llevar un registro diario de los alimentos que consume.   Control y Engineer, maintenance (IT) de los niveles de glucosa en [place].   Control de los niveles de cetona en la [place], [place] esto ya no se considera necesario en la mayora de los [place].  INSTRUCCIONES PARA EL CUIDADO DOMICILIARIO Mientras est embarazada:  Siga los consejos de su mdico relacionados con los controles prenatales, la planificacin de la comida, la actividad fsica, los [place], vitaminas, los anlisis de sangre y otras pruebas y las actividades fsicas.   Lleve un registro de las comidas, las pruebas de glucosa en sangre y la cantidad de insulina que recibe (si corresponde). Muestre todo al profesional en cada consulta mdica prenatal.   Si sufre diabetes mellitus gestacional, podr tener problemas de hipoglucemia (nivel bajo de glucosa en sangre). Podr sospechar este problema si se siente repentinamente mareada, tiene temblores y/o se siente dbil. Si cree que esto le est ocurriendo, y tiene un medidor de glucosa, mida su nivel de Event organiser. Siga los consejos de su mdico sobre el modo y el momento de tratar su nivel de glucosa en sangre. Generalmente se sigue la regla 15:15 Consuma 15 g de hidratos de carbono, espere 15 minutos y Programmer, systems el nivel de glucosa en [place].Barbara Cower de 15 g de hidratos de carbono son:   1 taza de [employer].    taza de jugo.   3-4 tabletas de glucosa.   5-6 caramelos duros.   1 caja pequea de pasas de uva.    taza de gaseosa comn.   Mantenga una buena higiene para evitar infecciones.   No fume.  SOLICITE ATENCIN MDICA SI:  Observa prdida vaginal con o sin picazn.   Se siente ms dbil o cansada que lo habitual.   [place].   Tiene un aumento de peso repentino, 2,5 kg o ms en una semana.   Pierde peso, 1.5 kg o ms en una semana.   Su nivel de glucosa en sangre es elevado, necesita instrucciones.  SOLICITE ATENCIN MDICA DE  INMEDIATO SI:  Sufre una cefalea intensa.   Se marea o pierde el conocimiento   Presenta nuseas o vmitos.   Se siente desorientada confundida.   Sufre convulsiones.   Tiene problemas de visin.   Siente Physiological scientist.   Presenta una hemorragia vaginal abundante.   Tiene contracciones uterinas.   Tiene una prdida importante de lquido por la vagina  DESPUS QUE NACE EL BEB:  Concurra a todos los controles de seguimiento y Clinical biochemist los anlisis de sangre segn las indicaciones de su mdico.   Mantenga un estilo de vida saludable para evitar la diabetes en el futuro. Aqu se incluye:   Siga el plan de alimentacin saludable.   Controle su peso.   Practique actividad fsica y descanse lo necesario.   No fume.   Amamante a su beb mientras pueda. Esto disminuir la probabilidad de que usted y su beb sufran diabetes posteriormente.  Para ms informacin acerca de la diabetes, visite la pgina web de Holiday representative Diabetes Association: [email or web address].com.cy. Para ms informacin acerca de la diabetes gestacional cite la pgina web del [employer] Obst[employer]ians and Gynecologists en: RentRule.com.au. Document Released: 05/23/2005 Document Revised: 04/25/2011 ExitCare Patient Information 2012  ExitCare[employer].Diabetes y Doroteo Glassman fsica (Diabetes and Exercise) La actividad fsica regular es muy importante y [place] a:   Chief Operating Officer el nivel de glucosa en sangre (azcar).   Disminuir la presin arterial.   Controlar el colesterol en sangre (colesterol y triglicridos).   Mejorar el estado de salud general.  BENEFICIOS DE LA ACTIVIDAD FSICA  Mejora el buen estado fsico.   Mejora la flexibilidad.   Aumenta la resistencia.   Aumenta la densidad sea.   Favorece el control del peso.   Aumenta la fuerza muscular.   Disminuye la Art gallery manager.   Mejora la utilizacin de la insulina por parte del organismo.   Aumenta la sensibilidad a la  insulina.   Reduce las necesidades de insulina.   Har que se sienta mejor.   Reduce el estrs y las tensiones.  Las personas diabticas que incorporan la actividad fsica a su estilo de vida obtienen beneficios adicionales.  Prdida de peso.   Reduccin del apetito.   Mejora la utilizacin de la glucosa por parte del organismo.   Disminuye los factores de riesgo para las enfermedades cardacas.   Disminuye el colesterol y los triglicridos.   Eleva el nivel de colesterol bueno (lipoproteinas de alta densidad HDL).   Disminuye el nivel de Banker.   Disminuye la presin arterial.  DIABETES TIPO I Y ACTIVIDAD FSICA  La actividad fsica disminuir el nivel de glucosa en [place].   Si el nivel de glucosa en sangre es de ms de 240 mg/dl, controle las cetonas en la [place]. Si hay cetonas, no realice actividad fisica.   El sitio de inyeccin de la insulina puede requerir un ajuste cuando se realiza actividad fsica. Evite inyectarse insulina en las zonas del cuerpo que ejercitar. Por ejemplo, evite inyectarse insulina en:   Los brazos, si juega al tenis.   Las piernas, si corre. Para obtener ms informacion, consulte a su mdico.   Lleve un registro de:   La ingesta de alimentos.   El tipo y cantidad de [place].   Los momentos esperables de picos de accin de la insulina.   Los niveles de Event organiser.  Hgalo antes, durante y despus de Printmaker actividad fsica. Verifique los registros junto con su mdico. Esto ser de utilidad para la confeccin de pautas para ajustar la ingesta de alimentos o las cantidades de [place].  DIABETES TIPO 2 Y ACTIVIDAD FSICA  La actividad fsica regular ayuda a controlar el nivel de glucosa en [place].   La actividad fsica es importante porque:   Aumenta la sensibilidad del organismo a la insulina.   Mejora el control del nivel de Event organiser.   Reduce el riesgo de enfermedades cardiacas.  Disminuye el colesterol srico y los triglicridos. Disminuye la presin arterial.   Las personas que reciben insulina o agentes hipoglucemiantes por va oral deben controlar la aparicin de signos de hipoglucemia (mareos, temblores, sudoracin, escalofros y confusin).   Durante la actividad fsica se pierde Data processing manager. Esta prdida de lquidos debe reponerse. De este modo se evita la prdida de lquidos corporales (deshidratacin) y el golpe de Airline pilot.  Comente con su mdico antes de comenzar un programa de actividad fsica para verificar que sea seguro para usted. Recuerde, cualquier actividad es mejor que ninguna.  Document Released: 09/02/2007 Document Revised: 04/25/2011 [facility] Patient Information 2012 [place], [name].

## 2011-08-22 ENCOUNTER — Other Ambulatory Visit: Payer: Self-pay

## 2011-08-23 LAB — GLUCOSE TOLERANCE, 3 HOURS
Glucose Tolerance, 2 hour: 155 mg/dL (ref 70–164)
Glucose, GTT - 3 Hour: 109 mg/dL (ref 70–144)

## 2011-08-28 NOTE — L&D Delivery Note (Signed)
Delivery Note At 11:29 AM a viable female was delivered via Vaginal, Spontaneous Delivery (Presentation: Right Occiput Anterior).  APGAR: 9, 9; weight .   Placenta status: Intact, Spontaneous.  Cord: 3 vessels with the following complications: None.   Anesthesia: Epidural  Episiotomy: None Lacerations: 2nd degree Suture Repair: 3.0 vicryl Est. Blood Loss (mL): 200  Mom to postpartum.  Baby to nursery-stable.  Sheera Illingworth JEHIEL 01/27/2012, 11:49 AM

## 2011-09-12 ENCOUNTER — Ambulatory Visit (INDEPENDENT_AMBULATORY_CARE_PROVIDER_SITE_OTHER): Payer: Self-pay | Admitting: Family Medicine

## 2011-09-12 DIAGNOSIS — Z3480 Encounter for supervision of other normal pregnancy, unspecified trimester: Secondary | ICD-10-CM

## 2011-09-12 DIAGNOSIS — O0990 Supervision of high risk pregnancy, unspecified, unspecified trimester: Secondary | ICD-10-CM | POA: Insufficient documentation

## 2011-09-12 DIAGNOSIS — O09299 Supervision of pregnancy with other poor reproductive or obstetric history, unspecified trimester: Secondary | ICD-10-CM

## 2011-09-12 LAB — POCT URINALYSIS DIP (DEVICE)
Bilirubin Urine: NEGATIVE
Ketones, ur: NEGATIVE mg/dL
Specific Gravity, Urine: 1.02 (ref 1.005–1.030)

## 2011-09-12 NOTE — Patient Instructions (Signed)
Embarazo - Segundo trimestre (Pregnancy - Second Trimester) El segundo trimestre del embarazo (del 3 al 6mes) es un perodo de evolucin rpida para usted y el beb. Hacia el final del sexto mes, el beb mide aproximadamente 23 cm y pesa 680 g. Comenzar a sentir los movimientos del beb entre las 18 y las 20 semanas de embarazo. Podr sentir las pataditas ("quickening en ingls"). Hay un rpido aumento de peso. Puede segregar un lquido claro (calostro) de las mamas. Quizs sienta pequeas contracciones en el vientre (tero) Esto se conoce como falso trabajo de parto o contracciones de Braxton-Hicks. Es como una prctica del trabajo de parto que se produce cuando el beb est listo para salir. Generalmente los problemas de vmitos matinales ya se han superado hacia el final del primer trimestre. Algunas mujeres desarrollan pequeas manchas oscuras (que se denominan cloasma, mscara del embarazo) en la cara que normalmente se van luego del nacimiento del beb. La exposicin al sol empeora las manchas. Puede desarrollarse acn en algunas mujeres embarazadas, y puede desaparecer en aquellas que ya tienen acn. EXAMENES PRENATALES  Durante los exmenes prenatales, deber seguir realizando pruebas de sangre, segn avance el embarazo. Estas pruebas se realizan para controlar su salud y la del beb. Tambin se realizan anlisis de sangre para conocer los niveles de hemoglobina. La anemia (bajo nivel de hemoglobina) es frecuente durante el embarazo. Para prevenirla, se administran hierro y vitaminas. Tambin se le realizarn exmenes para saber si tiene diabetes entre las 24 y las 28 semanas del embarazo. Podrn repetirle algunas de las pruebas que le hicieron previamente.   En cada visita le medirn el tamao del tero. Esto se realiza para asegurarse de que el beb est creciendo correctamente de acuerdo al estado del embarazo.   Tambin en cada visita prenatal controlarn su presin arterial. Esto se realiza  para asegurarse de que no tenga toxemia.   Se controlar su orina para asegurarse de que no tenga infecciones, diabetes o protena en la orina.   Se controlar su peso regularmente para asegurarse que el aumento ocurre al ritmo indicado. Esto se hace para asegurarse que usted y el beb tienen una evolucin normal.   En algunas ocasiones se realiza una prueba de ultrasonido para confirmar el correcto desarrollo y evolucin del beb. Esta prueba se realiza con ondas sonoras inofensivas para el beb, de modo que el profesional pueda calcular ms precisamente la fecha del parto.  Algunas veces se realizan pruebas especializadas del lquido amnitico que rodea al beb. Esta prueba se denomina amniocentesis. El lquido amnitico se obtiene introduciendo una aguja en el vientre (abdomen). Se realiza para controlar los cromosomas en aquellos casos en los que existe alguna preocupacin acerca de algn problema gentico que pueda sufrir el beb. En ocasiones se lleva a cabo cerca del final del embarazo, si es necesario inducir al parto. En este caso se realiza para asegurarse que los pulmones del beb estn lo suficientemente maduros como para que pueda vivir fuera del tero. CAMBIOS QUE OCURREN EN EL SEGUNDO TRIMESTRE DEL EMBARAZO Su organismo atravesar numerosos cambios durante el embarazo. Estos pueden variar de una persona a otra. Converse con el profesional que la asiste acerca los cambios que usted note y que la preocupen.  Durante el segundo trimestre probablemente sienta un aumento del apetito. Es normal tener "antojos" de ciertas comidas. Esto vara de una persona a otra y de un embarazo a otro.   El abdomen inferior comenzar a abultarse.   Podr tener la necesidad   de orinar con ms frecuencia debido a que el tero y el beb presionan sobre la vejiga. Tambin es frecuente contraer ms infecciones urinarias durante el embarazo (dolor al orinar). Puede evitarlas bebiendo gran cantidad de lquidos y  vaciando la vejiga antes y despus de mantener relaciones sexuales.   Podrn aparecer las primeras estras en las caderas, abdomen y mamas. Estos son cambios normales del cuerpo durante el embarazo. No existen medicamentos ni ejercicios que puedan prevenir estos cambios.   Es posible que comience a desarrollar venas inflamadas y abultadas (varices) en las piernas. El uso de medias de descanso, elevar sus pies durante 15 minutos, 3 a 4 veces al da y limitar la sal en su dieta ayuda a aliviar el problema.   Podr sentir acidez gstrica a medida que el tero crece y presiona contra el estmago. Puede tomar anticidos, con la autorizacin de su mdico, para aliviar este problema. Tambin es til ingerir pequeas comidas 4 a 5 veces al da.   La constipacin puede tratarse con un laxante o agregando fibra a su dieta. Beber grandes cantidades de lquidos, comer vegetales, frutas y granos integrales es de gran ayuda.   Tambin es beneficioso practicar actividad fsica. Si ha sido una persona activa hasta el embarazo, podr continuar con la mayora de las actividades durante el mismo. Si ha sido menos activa, puede ser beneficioso que comience con un programa de ejercicios, como realizar caminatas.   Puede desarrollar hemorroides (vrices en el recto) hacia el final del segundo trimestre. Tomar baos de asiento tibios y utilizar cremas recomendadas por el profesional que lo asiste sern de ayuda para los problemas de hemorroides.   Tambin podr sentir dolor de espalda durante este momento de su embarazo. Evite levantar objetos pesados, utilice zapatos de taco bajo y mantenga una buena postura para ayudar a reducir los problemas de espalda.   Algunas mujeres embarazadas desarrollan hormigueo y adormecimiento de la mano y los dedos debido a la hinchazn y compresin de los ligamentos de la mueca (sndrome del tnel carpiano). Esto desaparece una vez que el beb nace.   Como sus pechos se agrandan,  necesitar un sujetador ms grande. Use un sostn de soporte, cmodo y de algodn. No utilice un sostn para amamantar hasta el ltimo mes de embarazo si va a amamantar al beb.   Podr observar una lnea oscura desde el ombligo hacia la zona pbica denominada linea nigra.   Podr observar que sus mejillas se ponen coloradas debido al aumento de flujo sanguneo en la cara.   Podr desarrollar "araitas" en la cara, cuello y pecho. Esto desaparece una vez que el beb nace.  INSTRUCCIONES PARA EL CUIDADO DOMICILIARIO  Es extremadamente importante que evite el cigarrillo, hierbas medicinales, alcohol y las drogas no prescriptas durante el embarazo. Estas sustancias qumicas afectan la formacin y el desarrollo del beb. Evite estas sustancias durante todo el embarazo para asegurar el nacimiento de un beb sano.   La mayor parte de los cuidados que se aconsejan son los mismos que los indicados para el primer trimestre del embarazo. Cumpla con las citas tal como se le indic. Siga las instrucciones del profesional que lo asiste con respecto al uso de los medicamentos, el ejercicio y la dieta.   Durante el embarazo debe obtener nutrientes para usted y para su beb. Consuma alimentos balanceados a intervalos regulares. Elija alimentos como carne, pescado, leche y otros productos lcteos descremados, vegetales, frutas, panes integrales y cereales. El profesional le informar cul es el   aumento de peso ideal.   Las relaciones sexuales fsicas pueden continuarse hasta cerca del fin del embarazo si no existen otros problemas. Estos problemas pueden ser la prdida temprana (prematura) de lquido amnitico de las membranas, sangrado vaginal, dolor abdominal u otros problemas mdicos o del embarazo.   Realice actividad fsica todos los das, si no tiene restricciones. Consulte con el profesional que la asiste si no sabe con certeza si determinados ejercicios son seguros. El mayor aumento de peso tiene lugar  durante los ltimos 2 trimestres del embarazo. El ejercicio la ayudar a:   Controlar su peso.   Ponerla en forma para el parto.   Ayudarla a perder peso luego de haber dado a luz.   Use un buen sostn o como los que se usan para hacer deportes para aliviar la sensibilidad de las mamas. Tambin puede serle til si lo usa mientras duerme. Si pierde calostro, podr utilizar apsitos en el sostn.   No utilice la baera con agua caliente, baos turcos y saunas durante el embarazo.   Utilice el cinturn de seguridad sin excepcin cuando conduzca. Este la proteger a usted y al beb en caso de accidente.   Evite comer carne cruda, queso crudo, y el contacto con los utensilios y desperdicios de los gatos. Estos elementos contienen grmenes que pueden causar defectos de nacimiento en el beb.   El segundo trimestre es un buen momento para visitar a su dentista y evaluar su salud dental si an no lo ha hecho. Es importante mantener los dientes limpios. Utilice un cepillo de dientes blando. Cepllese ms suavemente durante el embarazo.   Es ms fcil perder algo de orina durante el embarazo. Apretar y fortalecer los msculos de la pelvis la ayudar con este problema. Practique detener la miccin cuando est en el bao. Estos son los mismos msculos que necesita fortalecer. Son tambin los mismos msculos que utiliza cuando trata de evitar los gases. Puede practicar apretando estos msculos 10 veces, y repetir esto 3 veces por da aproximadamente. Una vez que conozca qu msculos debe apretar, no realice estos ejercicios durante la miccin. Puede favorecerle una infeccin si la orina vuelve hacia atrs.   Pida ayuda si tiene necesidades econmicas, de asesoramiento o nutricionales durante el embarazo. El profesional podr ayudarla con respecto a estas necesidades, o derivarla a otros especialistas.   La piel puede ponerse grasa. Si esto sucede, lvese la cara con un jabn suave, utilice un humectante no  graso y maquillaje con base de aceite o crema.  CONSUMO DE MEDICAMENTOS Y DROGAS DURANTE EL EMBARAZO  Contine tomando las vitaminas apropiadas para esta etapa tal como se le indic. Las vitaminas deben contener un miligramo de cido flico y deben suplementarse con hierro. Guarde todas las vitaminas fuera del alcance de los nios. La ingestin de slo un par de vitaminas o tabletas que contengan hierro puede ocasionar la muerte en un beb o en un nio pequeo.   Evite el uso de medicamentos, inclusive los de venta libre y hierbas que no hayan sido prescriptos o indicados por el profesional que la asiste. Algunos medicamentos pueden causar problemas fsicos al beb. Utilice los medicamentos de venta libre o de prescripcin para el dolor, el malestar o la fiebre, segn se lo indique el profesional que lo asiste. No utilice aspirina.   El consumo de alcohol est relacionado con ciertos defectos de nacimiento. Esto incluye el sndrome de alcoholismo fetal. Debe evitar el consumo de alcohol en cualquiera de sus formas. El cigarrillo   causa nacimientos prematuros y bebs de bajo peso. El uso de drogas recreativas est absolutamente prohibido. Son muy nocivas para el beb. Un beb que nace de una madre adicta, ser adicto al nacer. Ese beb tendr los mismos sntomas de abstinencia que un adulto.   Infrmele al profesional si consume alguna droga.   No consuma drogas ilegales. Pueden causarle mucho dao al beb.  SOLICITE ATENCIN MDICA SI: Tiene preguntas o preocupaciones durante su embarazo. Es mejor que llame para consultar las dudas que esperar hasta su prxima visita prenatal. De esta forma se sentir ms tranquila.  SOLICITE ATENCIN MDICA DE INMEDIATO SI:  La temperatura oral se eleva sin motivo por encima de 102 F (38.9 C) o segn le indique el profesional que lo asiste.   Tiene una prdida de lquido por la vagina (canal de parto). Si sospecha una ruptura de las membranas, tmese la  temperatura y llame al profesional para informarlo sobre esto.   Observa unas pequeas manchas, una hemorragia vaginal o elimina cogulos. Notifique al profesional acerca de la cantidad y de cuntos apsitos est utilizando. Unas pequeas manchas de sangre son algo comn durante el embarazo, especialmente despus de mantener relaciones sexuales.   Presenta un olor desagradable en la secrecin vaginal y observa un cambio en el color, de transparente a blanco.   Contina con las nuseas y no obtiene alivio de los remedios indicados. Vomita sangre o algo similar a la borra del caf.   [place] o sube ms de 900 g. en una semana, o segn lo indicado por el profesional que la asiste.   Observa que se le hinchan el rostro, las manos, los pies o las piernas.   Ha estado expuesta a la rubola y no ha sufrido la enfermedad.   Ha estado expuesta a la quinta enfermedad o a la varicela.   Presenta dolor abdominal. Las molestias en el ligamento redondo son una causa no cancerosa (benigna) frecuente de dolor abdominal durante el embarazo. El profesional que la asiste deber evaluarla.   Presenta dolor de cabeza intenso que no se alivia.   Presenta fiebre, diarrea, dolor al orinar o le falta la respiracin.   Presenta dificultad para ver, visin borrosa, o visin doble.   Sufre una cada, un accidente de trnsito o cualquier tipo de trauma.   Vive en un hogar en el que existe violencia fsica o mental.  Document Released: 05/23/2005 Document Revised: 04/25/2011 [employer] Patient Information 2012 [employer]. 

## 2011-09-12 NOTE — Progress Notes (Signed)
Round ligament pain on left.  Denies vaginal bleeding, abnormal vaginal discharge, contractions, loss of fluid.  Reports good fetal activity.  Follow up in 4 weeks.  Scheduled [place].  3hr gtt normal.  Will repeat at 28 weeks.

## 2011-09-12 NOTE — Progress Notes (Signed)
Ob US scheduled for 09/18/11 @ 1000

## 2011-09-12 NOTE — Progress Notes (Signed)
Used language line spanish interpreter [phone] Has pain in side of her left leg.

## 2011-09-18 ENCOUNTER — Ambulatory Visit (HOSPITAL_COMMUNITY)
Admission: RE | Admit: 2011-09-18 | Discharge: 2011-09-18 | Disposition: A | Discharge status: Home / Self Care | Payer: Self-pay | Source: Ambulatory Visit | Attending: Family Medicine | Admitting: Family Medicine

## 2011-09-18 DIAGNOSIS — Z1389 Encounter for screening for other disorder: Secondary | ICD-10-CM | POA: Insufficient documentation

## 2011-09-18 DIAGNOSIS — Z3480 Encounter for supervision of other normal pregnancy, unspecified trimester: Secondary | ICD-10-CM

## 2011-09-18 DIAGNOSIS — Z8632 Personal history of gestational diabetes: Secondary | ICD-10-CM

## 2011-09-18 DIAGNOSIS — Z363 Encounter for antenatal screening for malformations: Secondary | ICD-10-CM | POA: Insufficient documentation

## 2011-09-18 DIAGNOSIS — O09299 Supervision of pregnancy with other poor reproductive or obstetric history, unspecified trimester: Secondary | ICD-10-CM

## 2011-09-18 DIAGNOSIS — O358XX0 Maternal care for other (suspected) fetal abnormality and damage, not applicable or unspecified: Secondary | ICD-10-CM | POA: Insufficient documentation

## 2011-10-10 ENCOUNTER — Ambulatory Visit (INDEPENDENT_AMBULATORY_CARE_PROVIDER_SITE_OTHER): Payer: Self-pay | Admitting: Physician Assistant

## 2011-10-10 VITALS — BP 105/66 | Temp 97.2°F | Wt 127.0 lb

## 2011-10-10 DIAGNOSIS — O09299 Supervision of pregnancy with other poor reproductive or obstetric history, unspecified trimester: Secondary | ICD-10-CM

## 2011-10-10 DIAGNOSIS — Z3480 Encounter for supervision of other normal pregnancy, unspecified trimester: Secondary | ICD-10-CM

## 2011-10-10 LAB — POCT URINALYSIS DIP (DEVICE)
Hgb urine dipstick: NEGATIVE
Ketones, ur: NEGATIVE mg/dL
Protein, ur: NEGATIVE mg/dL
Specific Gravity, Urine: 1.025 (ref 1.005–1.030)
Urobilinogen, UA: 0.2 mg/dL (ref 0.0–1.0)
pH: 6.5 (ref 5.0–8.0)

## 2011-10-10 MED ORDER — PRENATAL PLUS 27-1 MG PO TABS: 1.0000 | ORAL_TABLET | Freq: Every day | ORAL | Status: DC

## 2011-10-10 NOTE — Patient Instructions (Signed)
Dolor del ligamento redondo (Round Ligament Pain) El ligamento redondo se compone de msculo y tejido fibroso. Est unido al tero cerca de las trompas de [place] El ligamento redondo est ubicado en ambos lados del tero y ayuda a mantener su posicin. Normalmente comienza en el segundo trimestre del embarazo cuando el tero sale hacia afuera de la pelvis. El dolor puede aparecer y desaparecer hasta el nacimiento del beb. El dolor de ligamento redondo no es un problema serio y no ocasiona daos al beb. CAUSAS Durante el embarazo el tero crece mayormente desde el segundo trimestre hasta el parto. A medida que crece, se estira y tuerce ligeramente los ligamentos. Cuando el tero ejerce presin en ambos lados, el ligamento redondo del lado opuesto presiona y se estira. Esto causa dolor. SNTOMAS El dolor puede ocurrir en uno o ambos lados. El dolor es por lo general como un pellizco corto y filoso. A veces puede ser un dolor opaco y persistente. Se siente en la parte baja del abdomen o en la ingle. Es un dolor interno, y por lo general comienza en la ingle y se mueve hacia la zona de la cadera. El dolor puede ocurrir con:  Cambio de posicin repentino como el levantarse de la cama o de una silla.   Darse vuelta en la cama.   Toser o estornudar.   Caminar demasiado.   Cualquier tipo de actividad fsica.  DIAGNSTICO El medico deber asegurarse de que no existen problemas graves que ocasionan el dolor. Si no encuentra nada grave, los sntomas suelen indicar de que se trata de un dolor proveniente del ligamento redondo. TRATAMIENTO  Sintese y reljese cuando el dolor comience.   Llevar las rodillas hacia el estmago.   Recustese sobre un lado con una almohada debajo del vientre (abdomen) y otra entre sus piernas.   Sintese en un bao caliente de 15 a 20 minutos o hasta que el dolor desaparezca.  INSTRUCCIONES PARA EL CUIDADO DOMICILIARIO  Utilice los medicamentos de venta libre o de  prescripcin para el dolor, el malestar o la fiebre, segn se lo indique el profesional que lo asiste.   Sintese y pngase de pie lentamente.   Evite caminatas largas si le ocasionan dolor.   Detenga o disminuya las actividades fsicas si le ocasionan dolor.  SOLICITE ATENCIN MDICA SI:  El dolor no desaparece con estas medidas.   Necesita que le prescriban medicamentos ms fuertes para el dolor.   Desarrolla un dolor de espalda que no haba sentido antes junto con el de lado.  SOLICITE ATENCIN MDICA DE INMEDIATO SI:  La temperatura se eleva por encima de 102 F (38.9 C) o ms.   Siente contracciones uterinas.   Presenta hemorragia vaginal.   Presenta nuseas, diarrea o vmitos.   Comienza a sentir escalofros   Siente dolor al orinar.  Document Released: 07/26/2008 Document Revised: 04/25/2011 [employer] Patient Information 2012 [employer]. 

## 2011-10-10 NOTE — Progress Notes (Signed)
Comfort measures reviewed for RLP. NL anatomy reviewed with patient. 1 hour glucola at next visit

## 2011-11-07 ENCOUNTER — Ambulatory Visit (INDEPENDENT_AMBULATORY_CARE_PROVIDER_SITE_OTHER): Payer: Self-pay | Admitting: Family Medicine

## 2011-11-07 VITALS — BP 95/63 | Temp 97.1°F | Wt 130.8 lb

## 2011-11-07 DIAGNOSIS — O09299 Supervision of pregnancy with other poor reproductive or obstetric history, unspecified trimester: Secondary | ICD-10-CM

## 2011-11-07 LAB — POCT URINALYSIS DIP (DEVICE)
Hgb urine dipstick: NEGATIVE
Nitrite: NEGATIVE
Specific Gravity, Urine: 1.02 (ref 1.005–1.030)
Urobilinogen, UA: 0.2 mg/dL (ref 0.0–1.0)
pH: 6 (ref 5.0–8.0)

## 2011-11-07 NOTE — Patient Instructions (Signed)
Prevencin de parto prematuro (Preventing Preterm Labor) Un parto prematuro ocurre cuando la mujer embarazada tiene contracciones uterinas que causan la apertura, el acortamiento y el afinamiento del cuello del tero, antes de las 37 semanas de embarazo. Tendr contracciones regulares cada 2 a 3 minutos. Esto generalmente causa molestias o dolor. CUIDADOS EN EL HOGAR  Consuma una dieta saludable.   Tome las vitaminas segn le haya indicado el mdico.   Beba una cantidad de lquido suficiente como para mantener la orina de tono claro o color amarillo plido todos los das.   Descanse y duerma.   No tenga relaciones sexuales si tiene un parto prematuro o alto riesgo de tenerlo.   Siga las instrucciones del mdico acerca de su actividad, los medicamentos y los exmenes.   Evite el estrs.   Evite los esfuerzos extenuantes o la actividad fsica extensa.   No fume.  SOLICITE AYUDA DE INMEDIATO SI:   Tiene contracciones.   Siente dolor abdominal.   Tiene sangrado que proviene de la vagina.   Siente dolor al orinar.   Observa una secrecin anormal que proviene de la vagina.   Tiene una temperatura oral de ms de 102 F (38.9 C).  ASEGRESE DE QUE:  Comprende estas instrucciones.   Controlar su enfermedad.   Solicitar ayuda si no mejora o si empeora.  Document Released: 09/15/2010 Document Revised: 08/02/2011 [employer] Patient Information 2012 [employer]. 

## 2011-11-07 NOTE — Progress Notes (Addendum)
S: 3 episodes of sticky watery discharge over the last 3 weeks.  Also having intermittent stomach tightening; was every 30 minutes then resolved.  No vaginal bleeding, good fetal movement.  O: vitals reviewed FH 26cm FHT 140 Fern negative  A/P: 26 year old G3P2002 at [redacted]w[redacted]d -wet prep for discharge -1hr GTT today -PTL precautions reviewed -return to clinic in 4 weeks

## 2011-11-07 NOTE — Progress Notes (Signed)
P=77, c/o pain left groin down leg all the time, c/o 3 times had watery discharge over  The last few weeks,

## 2011-11-08 ENCOUNTER — Other Ambulatory Visit: Payer: Self-pay | Admitting: Family Medicine

## 2011-11-08 LAB — WET PREP, GENITAL

## 2011-11-08 LAB — RPR

## 2011-11-08 LAB — CBC
MCH: 31.2 pg (ref 26.0–34.0)
MCHC: 33.7 g/dL (ref 30.0–36.0)
MCV: 92.5 fL (ref 78.0–100.0)
Platelets: 272 10*3/uL (ref 150–400)

## 2011-11-08 MED ORDER — METRONIDAZOLE 500 MG PO TABS: 500.0000 mg | ORAL_TABLET | Freq: Two times a day (BID) | ORAL | Status: DC

## 2011-11-08 NOTE — Progress Notes (Signed)
Chart review done.  Agree with resident note.   

## 2011-11-15 ENCOUNTER — Telehealth: Payer: Self-pay | Admitting: *Deleted

## 2011-11-15 NOTE — Telephone Encounter (Signed)
Message copied by Mannie Stabile on Thu Nov 15, 2011  8:54 AM ------      Message from: Levie Heritage      Created: Thu Nov 08, 2011  9:10 AM       Pos wet prep for BV - flagyl 500mg  BID x 7 days sent to pharmacy.      Pos 1hr GTT - needs to schedule 3hr GTT.      Thanks!

## 2011-11-15 NOTE — Telephone Encounter (Signed)
Attempted to call patient with Karen Gamble phone number is disconnected. No other numbers on file.

## 2011-11-18 ENCOUNTER — Inpatient Hospital Stay (HOSPITAL_COMMUNITY)
Admission: AD | Admit: 2011-11-18 | Discharge: 2011-11-18 | Disposition: A | Discharge status: Home / Self Care | Payer: Self-pay | Source: Ambulatory Visit | Attending: Obstetrics & Gynecology | Admitting: Obstetrics & Gynecology

## 2011-11-18 ENCOUNTER — Encounter (HOSPITAL_COMMUNITY): Payer: Self-pay | Admitting: *Deleted

## 2011-11-18 DIAGNOSIS — A088 Other specified intestinal infections: Secondary | ICD-10-CM | POA: Insufficient documentation

## 2011-11-18 DIAGNOSIS — R197 Diarrhea, unspecified: Secondary | ICD-10-CM | POA: Insufficient documentation

## 2011-11-18 DIAGNOSIS — O99891 Other specified diseases and conditions complicating pregnancy: Secondary | ICD-10-CM | POA: Insufficient documentation

## 2011-11-18 DIAGNOSIS — A084 Viral intestinal infection, unspecified: Secondary | ICD-10-CM

## 2011-11-18 DIAGNOSIS — Z3480 Encounter for supervision of other normal pregnancy, unspecified trimester: Secondary | ICD-10-CM

## 2011-11-18 DIAGNOSIS — O212 Late vomiting of pregnancy: Secondary | ICD-10-CM | POA: Insufficient documentation

## 2011-11-18 LAB — URINALYSIS, ROUTINE W REFLEX MICROSCOPIC
Glucose, UA: NEGATIVE mg/dL
Hgb urine dipstick: NEGATIVE
Leukocytes, UA: NEGATIVE
Protein, ur: NEGATIVE mg/dL
Specific Gravity, Urine: 1.015 (ref 1.005–1.030)
Urobilinogen, UA: 1 mg/dL (ref 0.0–1.0)

## 2011-11-18 LAB — COMPREHENSIVE METABOLIC PANEL
ALT: 55 U/L — ABNORMAL HIGH (ref 0–35)
AST: 31 U/L (ref 0–37)
Alkaline Phosphatase: 79 U/L (ref 39–117)
Calcium: 9.2 mg/dL (ref 8.4–10.5)
Glucose, Bld: 70 mg/dL (ref 70–99)
Potassium: 3.6 mEq/L (ref 3.5–5.1)
Sodium: 136 mEq/L (ref 135–145)
Total Protein: 6.6 g/dL (ref 6.0–8.3)

## 2011-11-18 LAB — AMNISURE RUPTURE OF MEMBRANE (ROM) NOT AT ARMC: Amnisure ROM: NEGATIVE

## 2011-11-18 LAB — CBC
HCT: 39 % (ref 36.0–46.0)
Hemoglobin: 13.4 g/dL (ref 12.0–15.0)
MCH: 30.9 pg (ref 26.0–34.0)
MCHC: 34.4 g/dL (ref 30.0–36.0)
MCV: 89.9 fL (ref 78.0–100.0)
RDW: 13 % (ref 11.5–15.5)

## 2011-11-18 MED ORDER — ONDANSETRON HCL 4 MG PO TABS: 4.0000 mg | ORAL_TABLET | Freq: Three times a day (TID) | ORAL | Status: AC | PRN

## 2011-11-18 MED ORDER — ONDANSETRON HCL 4 MG/2ML IJ SOLN
4.0000 mg | Freq: Once | INTRAMUSCULAR | Status: AC
  Administered 2011-11-18: 4 mg via INTRAVENOUS
  Filled 2011-11-18: qty 2

## 2011-11-18 MED ORDER — LACTATED RINGERS IV BOLUS (SEPSIS)
1000.0000 mL | Freq: Once | INTRAVENOUS | Status: AC
  Administered 2011-11-18: 1000 mL via INTRAVENOUS

## 2011-11-18 NOTE — MAU Provider Note (Signed)
  History     CSN: [id number]  Arrival date and time: 11/18/11 2010   First Provider Initiated Contact with Patient 11/18/11 2048      Chief Complaint  Patient presents with  . Nausea   HPI  26 year old G3P2 @ [redacted]w[redacted]d:   Patient presents with nausea, vomiting, and diarrhea for two days.  She is also having body aches.  She also says she thinks she is leaking fluid.  She thought that before, but the clinic told her everything was fine.  She has two children, and says they have been sick with vomiting and diarrhea.  She has not been able to keep anything down for about 24 hours. She says she is still urinating OK and that her urine is yellow.  Denies blood/bilious vomiting, denies blood in diarrhea.   OB History    Grav Para Term Preterm Abortions TAB SAB Ect Mult Living   3 2 2  0 0 0 0 0 0 2      Past Medical History  Diagnosis Date  . No pertinent past medical history   . Gestational diabetes     2nd pregnancy    Past Surgical History  Procedure Date  . Cholecystectomy     History reviewed. No pertinent family history.  History  Substance Use Topics  . Smoking status: Never Smoker   . Smokeless tobacco: Not on file  . Alcohol Use: No    Allergies: No Known Allergies  Prescriptions prior to admission  Medication Sig Dispense Refill  . metroNIDAZOLE (FLAGYL) 500 MG tablet Take 1 tablet (500 mg total) by mouth 2 (two) times daily.  14 tablet  0  . prenatal vitamin w/FE, FA (PRENATAL 1 + 1) 27-1 MG TABS Take 1 tablet by mouth daily.  30 each  6    Review of Systems  Constitutional: Positive for chills.  Eyes: Negative for double vision.  Respiratory: Negative for shortness of breath.   Cardiovascular: Negative for chest pain.  Gastrointestinal: Positive for vomiting and diarrhea. Negative for blood in stool.  Genitourinary: Negative for dysuria.  Skin: Negative for rash.  Neurological: Positive for headaches. Negative for dizziness.   Physical Exam   Blood  pressure 104/57, pulse 104, temperature 97.7 F (36.5 C), resp. rate 18, height 4\' 11"  (1.499 m), weight 62.143 kg (137 lb), last menstrual period 05/07/2011, SpO2 99.00%.  Physical Exam  Constitutional: She appears well-developed and well-nourished. No distress.  HENT:  Head: Normocephalic and atraumatic.  Eyes: Pupils are equal, round, and reactive to light.  Neck: Normal range of motion. Neck supple.  Cardiovascular: Normal rate and regular rhythm.   Genitourinary: Uterus normal. Vaginal discharge found.       Thin white discharge, no pooling fluids.    MAU Course  Procedures  MDM Amniosure negative.  Wet prep WNL Urinalysis normal.  Assessment and Plan  26 year old G3P2002 at [redacted]w[redacted]d who presents with viral gastroenteritis and complaints of leakage of fluid - normal pregnancy discharge, no rupture of membranes - patient able to tolerate PO after zofran, she is s/p 1L LR bolus - Discharge home with Rx for zofran, instructed to call the clinic if not improving in 1-2 days.   Karen Gamble 11/18/2011, 8:55 PM

## 2011-11-18 NOTE — MAU Note (Signed)
Dr. Chamberlain at bedside.  Assessment done and poc discussed with pt.  

## 2011-11-18 NOTE — MAU Note (Signed)
Per interpreter pt reports nausea, vomiting, and diarrhea x today. Body aches x 3 days. Having vaginal "wetness" today, unsure if she is leaking fluid. G3P2

## 2011-11-18 NOTE — Discharge Instructions (Signed)
Gastroenteritis viral (Viral Gastroenteritis) La gastroenteritis viral tambin es conocida como gripe del estmago. Este trastorno afecta el estmago y el tubo digestivo. Puede causar diarrea y vmitos repentinos. La enfermedad generalmente dura entre 3 y 8 das. La mayora de las personas desarrolla una respuesta inmunolgica. Con el tiempo, esto elimina el virus. Mientras se desarrolla esta respuesta natural, el virus puede afectar en forma importante su salud.  CAUSAS Muchos virus diferentes pueden causar gastroenteritis, por ejemplo el rotavirus o el norovirus. Estos virus pueden contagiarse al consumir alimentos o agua contaminados. Tambin puede contagiarse al compartir utensilios u otros artculos personales con una persona infectada o al tocar una superficie contaminada.  SNTOMAS Los sntomas ms comunes son diarrea y vmitos. Estos problemas pueden causar una prdida grave de lquidos corporales(deshidratacin) y un desequilibrio de sales corporales(electrolitos). Otros sntomas pueden ser:   Fiebre.   Dolor de cabeza.   Fatiga.   Dolor abdominal.  DIAGNSTICO  El mdico podr hacer el diagnstico de gastroenteritis viral basndose en los sntomas y el examen fsico Tambin pueden tomarle una muestra de materia fecal para diagnosticar la presencia de virus u otras infecciones.  TRATAMIENTO Esta enfermedad generalmente desaparece sin tratamiento. Los tratamientos estn dirigidos a la rehidratacin. Los casos ms graves de gastroenteritis viral implican vmitos tan intensos que no es posible retener lquidos. En estos casos, los lquidos deben administrarse a travs de una va intravenosa (IV).  INSTRUCCIONES PARA EL CUIDADO DOMICILIARIO  Beba suficientes lquidos para mantener la orina clara o de color amarillo plido. Beba pequeas cantidades de lquido con frecuencia y aumente la cantidad segn la tolerancia.   Pida instrucciones especficas a su mdico con respecto a la  rehidratacin.   Evite:   Alimentos que tengan mucha azcar.   Alcohol.   Gaseosas.   Tabaco.   Jugos.   Bebidas con cafena.   Lquidos muy calientes o fros.   Alimentos muy grasos.   Comer demasiado a la vez.   Productos lcteos hasta 24 a 48 horas despus de que se detenga la diarrea.   Puede consumir probiticos. Los probiticos son cultivos activos de bacterias beneficiosas. Pueden disminuir la cantidad y el nmero de deposiciones diarreicas en el adulto. Se encuentran en los yogures con cultivos activos y en los suplementos.   Lave bien sus manos para evitar que se disemine el virus.   Slo tome medicamentos de venta libre o recetados para calmar el dolor, las molestias o bajar la fiebre segn las indicaciones de su mdico. No administre aspirina a los nios. Los medicamentos antidiarreicos no son recomendables.   Consulte a su mdico si puede seguir tomando sus medicamentos recetados o de venta libre.   Cumpla con todas las visitas de control, segn le indique su mdico.  SOLICITE ATENCIN MDICA DE INMEDIATO SI:  No puede retener lquidos.   No hay emisin de orina durante 6 a 8 horas.   Le falta el aire.   Observa sangre en el vmito (se ve como caf molido) o en la materia fecal.   Siente dolor abdominal que empeora o se concentra en una zona pequea (se localiza).   Tiene nuseas o vmitos persistentes.   Tiene fiebre.   El paciente es un nio menor de 3 meses y tiene fiebre.   El paciente es un nio mayor de 3 meses, tiene fiebre y sntomas persistentes.   El paciente es un nio mayor de 3 meses y tiene fiebre y sntomas que empeoran repentinamente.   El paciente es   un beb y no tiene lgrimas cuando llora.  ASEGRESE QUE:   Comprende estas instrucciones.   Controlar su enfermedad.   Solicitar ayuda inmediatamente si no mejora o si empeora.  Document Released: 08/13/2005 Document Revised: 08/02/2011 [employer] Patient Information 2012  [employer]. 

## 2011-11-18 NOTE — MAU Provider Note (Signed)
Attestation of Attending Supervision of Resident: Evaluation and management procedures were performed by the [facility] Resident under my supervision.  I have evaluated the patient, reviewed the resident's note and chart, and I agree with management and plan.   Jaynie Collins, M.D. 11/18/2011 11:32 PM

## 2011-11-18 NOTE — Progress Notes (Signed)
SSE per Dr. Lula Olszewski.  Wet prep and cultures collected.  amnisure collected.

## 2011-11-19 LAB — GC/CHLAMYDIA PROBE AMP, GENITAL
Chlamydia, DNA Probe: NEGATIVE
GC Probe Amp, Genital: NEGATIVE

## 2011-11-21 NOTE — Telephone Encounter (Signed)
Front office , please mail patient letter in Spanish telling her needs 3hr appt for GTT and needs to pick up and take prescription for infection.

## 2011-11-21 NOTE — Telephone Encounter (Signed)
Called patient with interpreter- phone number disconnected, no other phone number on file. Next appointment not until 12/05/11. Will need to mail patient letter.

## 2011-11-22 ENCOUNTER — Encounter: Payer: Self-pay | Admitting: Obstetrics & Gynecology

## 2011-12-05 ENCOUNTER — Ambulatory Visit (INDEPENDENT_AMBULATORY_CARE_PROVIDER_SITE_OTHER): Payer: Self-pay | Admitting: Physician Assistant

## 2011-12-05 VITALS — BP 111/68 | Temp 97.7°F | Wt 133.2 lb

## 2011-12-05 DIAGNOSIS — O09299 Supervision of pregnancy with other poor reproductive or obstetric history, unspecified trimester: Secondary | ICD-10-CM

## 2011-12-05 LAB — POCT URINALYSIS DIP (DEVICE)
Bilirubin Urine: NEGATIVE
Glucose, UA: NEGATIVE mg/dL
Ketones, ur: NEGATIVE mg/dL
Leukocytes, UA: NEGATIVE
Nitrite: NEGATIVE
pH: 7 (ref 5.0–8.0)

## 2011-12-05 NOTE — Progress Notes (Signed)
Pulse-[id number]    Pain-low back pain radiates to leg, ligament pain

## 2011-12-05 NOTE — Patient Instructions (Signed)
Embarazo - Tercer trimestre (Pregnancy - Third Trimester) El tercer trimestre del embarazo (los ltimos 3 meses) es el perodo de cambios ms rpidos que atraviesan usted y el beb. El aumento de peso es ms rpido. El beb alcanza un largo de aproximadamente 50 cm (20 pulgadas) y pesa entre 2,700 y 4,500 kg (6 a 10 libras). El beb gana ms tejido graso y ya est listo para la vida fuera del cuerpo de la madre. Mientras estn en el interior, los bebs tienen perodos de sueo y vigilia, succionan el pulgar y tienen hipo. Quizs sienta pequeas contracciones del tero. Este es el falso trabajo de parto. Tambin se las conoce como contracciones de Braxton-Hicks. Es como una prctica del parto. Los problemas ms habituales de esta etapa del embarazo incluyen mayor dificultad para respirar, hinchazn de las manos y los pies por retencin de lquidos y la necesidad de orinar con ms frecuencia debido a que el tero y el beb presionan sobre la vejiga.  EXAMENES PRENATALES  Durante los exmenes prenatales, deber seguir realizando pruebas de sangre, segn avance el embarazo. Estas pruebas se realizan para controlar su salud y la del beb. Tambin se realizan anlisis de sangre para conocer los niveles de hemoglobina. La anemia (bajo nivel de hemoglobina) es frecuente durante el embarazo. Para prevenirla, se administran hierro y vitaminas. Tambin le harn nuevas pruebas para descartar la diabetes. Podrn repetirle algunas de las pruebas que le hicieron previamente.   En cada visita le medirn el tamao del tero. Es para asegurarse de que el beb se desarrolla correctamente.   Tambin en cada visita la pesarn. Esto se realiza para asegurarse de que aumenta de peso al ritmo indicado y que usted y su beb evolucionan normalmente.   En algunas ocasiones se realiza una ecografa para confirmar el correcto desarrollo y evolucin del beb. Esta prueba se realiza con ondas sonoras inofensivas para el beb, de modo  que el profesional pueda calcular con ms precisin la fecha del parto.   Discuta las posibilidades de la anestesia si necesita cesrea.  Algunas veces se realizan pruebas especializadas del lquido amnitico que rodea al beb. Esta prueba se denomina amniocentesis. El lquido amnitico se obtiene introduciendo una aguja en el abdomen (vientre). En ocasiones se lleva a cabo cerca del final del embarazo, si es necesario adelantar el parto. En este caso se realiza para asegurarse de que los pulmones del beb estn lo suficientemente maduros como para que pueda vivir fuera del tero. CAMBIOS QUE OCURREN EN EL TERCER TRIMESTRE DEL EMBARAZO Su organismo atravesar diferentes cambios durante el embarazo que varan de una persona a otra. Converse con el profesional que la asiste acerca los cambios que usted note y que la preocupen.  Durante el ltimo trimestre probablemente sienta un aumento del apetito. Es normal tener "antojos" de ciertas comidas. Esto vara de una persona a otra y de un embarazo a otro.   Podrn aparecer las primeras estras en las caderas, abdomen y mamas. Estos son cambios normales del cuerpo durante el embarazo. No existen medicamentos ni ejercicios que puedan prevenir estos cambios.   El estreimiento puede tratarse con un laxante o agregando fibra a su dieta. Beber grandes cantidades de lquidos, tomar fibras en forma de verduras, frutas y granos integrales es de gran ayuda.   Tambin es beneficioso practicar actividad fsica. Si ha sido una persona activa hasta el embarazo, podr continuar con la mayora de las actividades durante el mismo. Si ha sido menos activa, puede ser beneficioso   que comience con un programa de ejercicios, como realizar caminatas. Consulte con el profesional que la asiste antes de comenzar un programa de ejercicios.   Evite el consumo de cigarrillos, el alcohol, los medicamentos no prescritos y las "drogas de la calle" durante el embarazo. Estas sustancias  qumicas afectan la formacin y el desarrollo del beb. Evite estas sustancias durante todo el embarazo para asegurar el nacimiento de un beb sano.   Dolor de espalda, venas varicosas y hemorroides podran aparecer o empeorar.   Los movimientos del beb pueden ser ms bruscos y aparecer ms a menudo.   Puede que note dificultades para respirar facilmente.   El ombligo podra salrsele hacia afuera.   Puede segregar un lquido amarillento (calostro) de las mamas.   Puede segregar mucus con sangre. Esto normalmente ocurre unos pocos das a una semana antes de que comience el trabajo de parto.  INSTRUCCIONES PARA EL CUIDADO DOMICILIARIO  La mayor parte de los cuidados que se aconsejan son los mismos que los indicados para las primeras etapas del embarazo. Es importante que concurra a todas las citas con el profesional y siga sus instrucciones con respecto a los medicamentos que deba utilizar, a la actividad fsica y a la dieta.   Durante el embarazo debe obtener nutrientes para usted y para su beb. Consuma alimentos balanceados a intervalos regulares. Elija alimentos como carne, pescado, leche y otros productos lcteos descremados, verduras, frutas, panes integrales y cereales. El profesional le informar cul es el aumento de peso ideal.   Las relaciones sexuales pueden continuarse hasta casi el final del embarazo, si no se presentan otros problemas como prdida prematura (antes de tiempo) de lquido amnitico, hemorragia vaginal o dolor abdominal (en el vientre).   Realice actividad fsica todos los das, si no tiene restricciones. Consulte con el profesional que la asiste si no sabe con certeza si determinados ejercicios son seguros. El mayor aumento de peso se produce en los dos ltimos trimestres del embarazo.   Haga reposo con frecuencia, con las piernas elevadas, o segn lo necesite para evitar los calambres y el dolor de cintura.   Use un buen sostn o como los que se usan para hacer  deportes para aliviar la sensibilidad de las mamas. Tambin puede serle til si lo usa mientras duerme. Si pierde calostro, podr utilizar apsitos en el sostn.   No utilice la baera con agua caliente, baos turcos y saunas.   Colquese el cinturn de seguridad cuando conduzca. Este la proteger a usted y al beb en caso de accidente.   Evite comer carne cruda y el contacto con los utensilios y desperdicios de los gatos. Estos elementos contienen grmenes que pueden causar defectos de nacimiento en el beb.   Es fcil perder algo de orina durante el embarazo. Apretar y fortalecer los msculos de la pelvis la ayudar con este problema. Practique detener la miccin cuando est en el bao. Estos son los mismos msculos que necesita fortalecer. Son tambin los mismos msculos que utiliza cuando trata de evitar los gases. Puede practicar apretando estos msculos diez veces, y repetir esto tres veces por da aproximadamente. Una vez que conozca qu msculos debe contraer, no realice estos ejercicios durante la miccin. Puede favorecerle una infeccin si la orina vuelve hacia atrs.   Pida ayuda si tiene necesidades econmicas, de asesoramiento o nutricionales durante el embarazo. El profesional podr ayudarla con respecto a estas necesidades, o derivarla a otros especialistas.   Practique la ida hasta el hospital a modo   de prueba.   Tome clases prenatales junto con su pareja para comprender, practicar y hacer preguntas acerca del trabajo de parto y el nacimiento.   Prepare la habitacin del beb.   No viaje fuera de la ciudad a menos que sea absolutamente necesario y con el consejo del mdico.   Use slo zapatos bajos sin taco para tener un mejor equilibrio y prevenir cadas.  EL CONSUMO DE MEDICAMENTOS Y DROGAS DURANTE EL EMBARAZO  Contine tomando las vitaminas apropiadas para esta etapa tal como se le indic. Las vitaminas deben contener un miligramo de cido flico y deben suplementarse con  hierro. Guarde todas las vitaminas fuera del alcance de los nios. La ingestin de slo un par de vitaminas o comprimidos que contengan hierro pueden ocasionar la muerte en un beb o en un nio pequeo.   Evite el uso de medicamentos, inclusive los de venta libre, que no hayan sido prescritos o indicados por el profesional que la asiste. Algunos medicamentos pueden causar problemas fsicos al beb. Utilice los medicamentos de venta libre o de prescripcin para el dolor, el malestar o la fiebre, segn se lo indique el profesional que lo asiste. No utilice aspirina, ibuprofeno (Motrin, Advil, Nuprin) o naproxeno (Aleve) a menos que el profesional la autorice.   El alcohol se asocia a cierto nmero de defectos del nacimiento, incluido el sndrome de alcoholismo fetal. Debe evitar el consumo de alcohol en cualquiera de sus formas. El cigarrillo causa nacimientos prematuros y bebs de bajo peso al nacer. Las drogas de la calle son muy nocivas para el beb y estn absolutamente prohibidas. Un beb que nace de una madre adicta, ser adicto al nacer. Ese beb tendr los mismos sntomas de abstinencia que un adulto.   Infrmele al profesional si consume alguna droga.  SOLICITE ATENCIN MDICA SI: Tiene alguna preocupacin durante el embarazo. Es mejor que llame para formular las preguntas si no puede esperar hasta la prxima visita, que sentirse preocupada por ellas.  DECISIONES ACERCA DE LA CIRCUNCISIN Usted puede saber o no cul es el sexo de su beb. Si es un varn, ste es el momento de pensar acerca de la circuncisin. La circuncisin es la extirpacin del prepucio. Esta es la piel que cubre el extremo sensible del pene. No hay un motivo mdico que lo justifique. Generalmente la decisin se toma segn lo que sea popular en ese momento, o se basa en creencias religiosas. Podr conversar estos temas con el profesional que la asiste. SOLICITE ATENCIN MDICA DE INMEDIATO SI:  La temperatura oral se eleva  sin motivo por encima de 102 F (38.9 C) o segn le indique el profesional que la asiste.   Tiene una prdida de lquido por la vagina (canal de parto). Si sospecha una ruptura de las membranas, tmese la temperatura y llame al profesional para informarlo sobre esto.   Observa unas pequeas manchas, una hemorragia vaginal o elimina cogulos. Avsele al profesional acerca de la cantidad y de cuntos apsitos est utilizando.   Presenta un olor desagradable en la secrecin vaginal y observa un cambio en el color, de transparente a blanco.   Ha vomitado durante ms de 24 horas.   Presenta escalofros o fiebre.   Comienza a sentir falta de aire.   Siente ardor al orinar.   Baja o sube ms de 900 g (ms de 2 libras), o segn lo indicado por el profesional que la asiste. Observa que sbitamente se le hinchan el rostro, las manos, los pies o las   piernas.   Presenta dolor abdominal. Las molestias en el ligamento redondo son una causa benigna (no cancerosa) frecuente de dolor abdominal durante el embarazo, pero el profesional que la asiste deber evaluarlo.   Presenta dolor de cabeza intenso que no se alivia.   Si no siente los movimientos del beb durante ms de tres horas. Si piensa que el beb no se mueve tanto como lo haca habitualmente, coma algo que contenga azcar y recustese sobre el lado izquierdo durante una hora. El beb debe moverse al menos 4  5 veces por hora. Comunquese inmediatamente si el beb se mueve menos que lo indicado.   Se cae, se ve involucrada en un accidente automovilstico o sufre algn tipo de traumatismo.   En su hogar hay violencia mental o fsica.  Document Released: 05/23/2005 Document Revised: 08/02/2011 [employer] Patient Information 2012 [employer]. 

## 2011-12-05 NOTE — Progress Notes (Signed)
Irreg cramping 2-3 times per day. No bleeding or LOF. Left sciatic pain. Comfort measures and PTL precautions reviewed. S=D. Discussed abnormal 1 hour result, 3 GTT ASAP

## 2011-12-12 ENCOUNTER — Other Ambulatory Visit: Payer: Self-pay

## 2011-12-12 DIAGNOSIS — R7309 Other abnormal glucose: Secondary | ICD-10-CM

## 2011-12-12 LAB — POCT URINALYSIS DIP (DEVICE)
Glucose, UA: NEGATIVE mg/dL
Ketones, ur: NEGATIVE mg/dL
Leukocytes, UA: NEGATIVE
Protein, ur: NEGATIVE mg/dL

## 2011-12-13 ENCOUNTER — Encounter: Payer: Self-pay | Admitting: Physician Assistant

## 2011-12-13 ENCOUNTER — Telehealth: Payer: Self-pay | Admitting: *Deleted

## 2011-12-13 DIAGNOSIS — O24419 Gestational diabetes mellitus in pregnancy, unspecified control: Secondary | ICD-10-CM | POA: Insufficient documentation

## 2011-12-13 LAB — GLUCOSE TOLERANCE, 3 HOURS
Glucose Tolerance, 1 hour: 204 mg/dL — ABNORMAL HIGH (ref 70–189)
Glucose Tolerance, Fasting: 78 mg/dL (ref 70–104)
Glucose, GTT - 3 Hour: 149 mg/dL — ABNORMAL HIGH (ref 70–144)

## 2011-12-13 NOTE — Telephone Encounter (Addendum)
Attempted to call patient - called home number , no answer , unable to leave message , no other number available to reach patient. Will attempt to send letter.  Message copied by Gerome Apley on Thu Dec 13, 2011  4:06 PM ------      Message from: Maylon Cos E      Created: Thu Dec 13, 2011  1:10 PM       Please call patient with result. Need to transfer to [facility]. Please schedule an appt with Maggie ONLY for Monday

## 2011-12-17 ENCOUNTER — Encounter: Payer: Self-pay | Admitting: Family Medicine

## 2011-12-19 ENCOUNTER — Ambulatory Visit (INDEPENDENT_AMBULATORY_CARE_PROVIDER_SITE_OTHER): Payer: Self-pay | Admitting: Obstetrics and Gynecology

## 2011-12-19 ENCOUNTER — Encounter: Payer: Self-pay | Admitting: Obstetrics and Gynecology

## 2011-12-19 VITALS — BP 112/75 | Temp 96.9°F | Wt 134.7 lb

## 2011-12-19 DIAGNOSIS — O24419 Gestational diabetes mellitus in pregnancy, unspecified control: Secondary | ICD-10-CM

## 2011-12-19 DIAGNOSIS — O99810 Abnormal glucose complicating pregnancy: Secondary | ICD-10-CM

## 2011-12-19 DIAGNOSIS — O09299 Supervision of pregnancy with other poor reproductive or obstetric history, unspecified trimester: Secondary | ICD-10-CM

## 2011-12-19 LAB — POCT URINALYSIS DIP (DEVICE)
Hgb urine dipstick: NEGATIVE
Nitrite: NEGATIVE
Protein, ur: NEGATIVE mg/dL
Specific Gravity, Urine: 1.02 (ref 1.005–1.030)
Urobilinogen, UA: 0.2 mg/dL (ref 0.0–1.0)

## 2011-12-19 LAB — GLUCOSE, CAPILLARY: Glucose-Capillary: 110 mg/dL — ABNORMAL HIGH (ref 70–99)

## 2011-12-19 NOTE — Patient Instructions (Addendum)
Diabetes mellitus gestacional (Gestational Diabetes Mellitus) La diabetes mellitus gestacional se produce slo durante el embarazo. Aparece cuando el organismo no puede controlar adecuadamente la glucosa (azcar) que aumenta en la sangre despus de comer. Durante el embarazo, se produce una resistencia a la insulina (sensibilidad reducida a la insulina) debido a la liberacin de hormonas por parte de la placenta. Generalmente, el pncreas de una mujer embarazada produce la cantidad suficiente de insulina para vencer esa resistencia. Sin embargo, en la diabetes gestacional, hay insulina pero no cumple su funcin adecuadamente. Si la resistencia es lo suficientemente grave como para que el pncreas no produzca la cantidad de insulina suficiente, la glucosa extra se acumula en la sangre.  QUINES TIENEN RIESGO DE DESARROLLAR DIABETES GESTACIONAL?  Las mujeres con historia de diabetes en la familia.   Las mujeres de ms de 25 aos.   Las que presentan sobrepeso.   Las mujeres que pertenecen a ciertos grupos tnicos (latinas, afroamericanas, norteamericanas nativas, asiticas y las originarias de las islas del Pac[employer]ico.  QUE PUEDE OCURRIRLE AL BEB? Si el nivel de glucosa en sangre de la madre es demasiado elevado mientras este embarazada, el nivel extra de azcar pasar por el cordn umbilical hacia el beb. Algunos de los problemas del beb pueden ser:  Beb demasiado grande: si el nio recibe demasiada azcar, puede aumentar mucho de peso. Esto puede hacer que sea demasiado grande para nacer por parto normal (vaginal) por lo que ser necesario realizar una cesrea.   Bajo nivel de glucosa (hipoglucemia): el beb produce insulina extra en respuesta a la excesiva cantidad de azcar que obtiene de la madre. Cuando el beb nace y ya no necesita insulina extra, su nivel de azcar en sangre puede disminuir.   Ictericia (coloracin amarillenta de la piel y los ojos): esto es bastante frecuente en los  bebs. La causa es la acumulacin de una sustancia qumica denominada bilirrubina. No siempre es un trastorno grave, pero se observa con frecuencia en los bebs cuyas madres sufren diabetes gestacional.  RIESGOS PARA LA MADRE Las mujeres que han sufrido diabetes gestacional pueden tener ms riesgos para algunos problemas como:  Preeclampsia o toxemia, incluyendo problemas con hipertensin arterial. La presin arterial y los niveles de protenas en la orina deben controlarse con frecuencia.   Infecciones   Parto por cesrea.   Aparicin de diabetes tipo 2 en una etapa posterior de la vida. Alrededor del 30% al 50% sufrir diabetes posteriormente, especialmente las que son obesas.  DIAGNSTICO Las hormonas que causan resistencia a la insulina tienen su mayor nivel alrededor de las 24 a 28 semanas del embarazo. Si se experimentan sntomas, stos son similares a los sntomas que normalmente aparecen durante el embarazo.  La diabetes mellitus gestacional generalmente se diagnostica por medio de un mtodo en dos partes: 1. Despus de la 24 a 28 semanas de embarazo, la mujer debe beber una solucin que contiene glucosa y realizar un anlisis de sangre. Si el nivel de glucosa es elevado, la realizarn un segundo anlisis.  2. La prueba oral de tolerancia a la glucosa, que dura aproximadamente tres horas. Despus de realizar ayuno durante la noche, se controla nivel de glucosa en sangre. La mujer bebe una solucin que contiene glucosa y le realizan anlisis de glucosa en sangre cada hora.  Si la mujer tiene factores de riesgos para la diabetes mellitus gestacional, el mdico podr indicar el anlisis antes de las 24 semanas de embarazo. TRATAMIENTO El tratamiento est dirigido a mantener la glucosa en   sangre de la madre en un nivel normal y puede incluir:  La planificacin de los alimentos.   Recibir insulina u otro medicamento para controlar el nivel de glucosa en sangre.   La prctica de ejercicios.    Llevar un registro diario de los alimentos que consume.   Control y registro de los niveles de glucosa en sangre.   Control de los niveles de cetona en la orina, aunque esto ya no se considera necesario en la mayora de los embarazos.  INSTRUCCIONES PARA EL CUIDADO DOMICILIARIO Mientras est embarazada:  Siga los consejos de su mdico relacionados con los controles prenatales, la planificacin de la comida, la actividad fsica, los medicamentos, vitaminas, los anlisis de sangre y otras pruebas y las actividades fsicas.   Lleve un registro de las comidas, las pruebas de glucosa en sangre y la cantidad de insulina que recibe (si corresponde). Muestre todo al profesional en cada consulta mdica prenatal.   Si sufre diabetes mellitus gestacional, podr tener problemas de hipoglucemia (nivel bajo de glucosa en sangre). Podr sospechar este problema si se siente repentinamente mareada, tiene temblores y/o se siente dbil. Si cree que esto le est ocurriendo, y tiene un medidor de glucosa, mida su nivel de glucosa en sangre. Siga los consejos de su mdico sobre el modo y el momento de tratar su nivel de glucosa en sangre. Generalmente se sigue la regla 15:15 Consuma 15 g de hidratos de carbono, espere 15 minutos y vuelva controlar el nivel de glucosa en sangre.. Ejemplos de 15 g de hidratos de carbono son:   1 taza de leche descremada.    taza de jugo.   3-4 tabletas de glucosa.   5-6 caramelos duros.   1 caja pequea de pasas de uva.    taza de gaseosa comn.   Mantenga una buena higiene para evitar infecciones.   No fume.  SOLICITE ATENCIN MDICA SI:  Observa prdida vaginal con o sin picazn.   Se siente ms dbil o cansada que lo habitual.   Transpira mucho.   Tiene un aumento de peso repentino, 2,5 kg o ms en una semana.   Pierde peso, 1.5 kg o ms en una semana.   Su nivel de glucosa en sangre es elevado, necesita instrucciones.  SOLICITE ATENCIN MDICA DE  INMEDIATO SI:  Sufre una cefalea intensa.   Se marea o pierde el conocimiento   Presenta nuseas o vmitos.   Se siente desorientada confundida.   Sufre convulsiones.   Tiene problemas de visin.   Siente dolor en el estmago.   Presenta una hemorragia vaginal abundante.   Tiene contracciones uterinas.   Tiene una prdida importante de lquido por la vagina  DESPUS QUE NACE EL BEB:  Concurra a todos los controles de seguimiento y realice los anlisis de sangre segn las indicaciones de su mdico.   Mantenga un estilo de vida saludable para evitar la diabetes en el futuro. Aqu se incluye:   Siga el plan de alimentacin saludable.   Controle su peso.   Practique actividad fsica y descanse lo necesario.   No fume.   Amamante a su beb mientras pueda. Esto disminuir la probabilidad de que usted y su beb sufran diabetes posteriormente.  Para ms informacin acerca de la diabetes, visite la pgina web de la American Diabetes Association: www.americandiabetesassociation.org. Para ms informacin acerca de la diabetes gestacional cite la pgina web del American Congress of Obstetricians and Gynecologists en: www.acog.org. Document Released: 05/23/2005 Document Revised: 08/02/2011 ExitCare Patient Information 2012   [employer].Dolor del ligamento redondo (Round Ligament Pain) El ligamento redondo se compone de msculo y tejido fibroso. Est unido al tero cerca de las trompas de [place] El ligamento redondo est ubicado en ambos lados del tero y [place] a Pharmacologist su posicin. Normalmente comienza en el segundo trimestre del embarazo cuando el tero sale hacia afuera de la pelvis. El dolor puede aparecer y desaparecer hasta el nacimiento del beb. El dolor de ligamento redondo no es un problema serio y no ocasiona daos al beb. CAUSAS Durante el Consulting civil engineer tero crece mayormente desde el segundo trimestre [place]. A medida que crece, se estira y tuerce ligeramente  los ligamentos. Cuando el tero ejerce presin en ambos lados, el ligamento redondo del lado opuesto presiona y se Psychologist, occupational. Esto causa dolor. SNTOMAS El dolor puede ocurrir en uno o ambos lados. El dolor es por lo general como un pellizco corto y filoso. A veces puede ser un dolor opaco y persistente. Se siente en la parte baja del abdomen o en la ingle. Es un Adult nurse, y por lo general comienza en la ingle y se mueve hacia la zona de la cadera. El dolor puede ocurrir con:  Chief of Staff de posicin repentino como el levantarse de la cama o de una silla.   Darse vuelta en la cama.   Toser o estornudar.   Caminar demasiado.   Cualquier tipo de actividad fsica.  DIAGNSTICO El medico deber asegurarse de que no existen problemas graves que Audiological scientist. Si no encuentra nada grave, los sntomas suelen indicar de que se trata de un dolor proveniente del ligamento redondo. TRATAMIENTO  Sintese y reljese cuando el dolor comience.   Llevar las rodillas [employer].   Recustese sobre un lado con una almohada debajo del vientre (abdomen) y Oletta Lamas sus piernas.   Sintese en un bao caliente de 15 a 20 minutos o hasta que el dolor desaparezca.  INSTRUCCIONES PARA EL CUIDADO DOMICILIARIO  Utilice los medicamentos de venta libre o de prescripcin para Chief Technology Officer, el malestar o la [place], segn se lo indique el profesional que lo asiste.   Sintese y pngase de pie lentamente.   Evite caminatas largas si le ocasionan dolor.   Detenga o disminuya las actividades fsicas si Sports administrator.  SOLICITE ATENCIN MDICA SI:  El dolor no desaparece con estas medidas.   Necesita que le prescriban medicamentos ms fuertes para Chief Technology Officer.   Desarrolla un dolor de espalda que no haba sentido antes junto con el de lado.  SOLICITE ATENCIN MDICA DE INMEDIATO SI:  La temperatura se eleva por encima de 102 F (38.9 C) o ms.   Siente contracciones uterinas.   Presenta hemorragia  vaginal.   Presenta nuseas, diarrea o vmitos.   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  Document Released: 07/26/2008 Document Revised: 08/02/2011 [facility] Patient Information 2012 [place], [name].

## 2011-12-19 NOTE — Progress Notes (Signed)
Ob US for growth scheduled 12/21/11 @ 0915.

## 2011-12-19 NOTE — Progress Notes (Signed)
Unaware GDM, not on diet yet. Reviewed diet and F/U 5 d with Maggie at [facility]. Will schedule [place]. Groin discomfort w/ wqlking. Discussed RLP.

## 2011-12-19 NOTE — Progress Notes (Signed)
Edema- feet, hands "hands get hot in the palms even when it is cold"  Pulse- 89  Pressure- vaginal

## 2011-12-20 ENCOUNTER — Encounter (HOSPITAL_COMMUNITY): Payer: Self-pay | Admitting: *Deleted

## 2011-12-20 ENCOUNTER — Inpatient Hospital Stay (HOSPITAL_COMMUNITY)
Admission: AD | Admit: 2011-12-20 | Discharge: 2011-12-20 | Disposition: A | Discharge status: Home / Self Care | Payer: Self-pay | Source: Ambulatory Visit | Attending: Obstetrics & Gynecology | Admitting: Obstetrics & Gynecology

## 2011-12-20 DIAGNOSIS — O99891 Other specified diseases and conditions complicating pregnancy: Secondary | ICD-10-CM | POA: Insufficient documentation

## 2011-12-20 DIAGNOSIS — R51 Headache: Secondary | ICD-10-CM | POA: Insufficient documentation

## 2011-12-20 MED ORDER — DIPHENHYDRAMINE HCL 50 MG/ML IJ SOLN
25.0000 mg | Freq: Once | INTRAMUSCULAR | Status: AC
  Administered 2011-12-20: 25 mg via INTRAVENOUS
  Filled 2011-12-20: qty 1

## 2011-12-20 MED ORDER — BUTALBITAL-APAP-CAFFEINE 50-325-40 MG PO TABS
1.0000 | ORAL_TABLET | Freq: Once | ORAL | Status: AC
  Administered 2011-12-20: 1 via ORAL
  Filled 2011-12-20: qty 1

## 2011-12-20 MED ORDER — LACTATED RINGERS IV BOLUS (SEPSIS)
1000.0000 mL | Freq: Once | INTRAVENOUS | Status: AC
  Administered 2011-12-20: 1000 mL via INTRAVENOUS

## 2011-12-20 MED ORDER — METOCLOPRAMIDE HCL 5 MG/ML IJ SOLN
10.0000 mg | Freq: Once | INTRAMUSCULAR | Status: AC
  Administered 2011-12-20: 10 mg via INTRAVENOUS
  Filled 2011-12-20: qty 2

## 2011-12-20 MED ORDER — DEXAMETHASONE SODIUM PHOSPHATE 10 MG/ML IJ SOLN
10.0000 mg | Freq: Once | INTRAMUSCULAR | Status: AC
  Administered 2011-12-20: 10 mg via INTRAVENOUS
  Filled 2011-12-20: qty 1

## 2011-12-20 NOTE — MAU Provider Note (Addendum)
  History     CSN: [id number]  Arrival date and time: 12/20/11 1811   First Provider Initiated Contact with Patient 12/20/11 1910      No chief complaint on file.  HPI This is a 26 y.o. female at [redacted]w[redacted]d who presents with 3 day history of headache. Mostly frontal. No vision changes. Took Tylenol with no relief. Followed in HR clinic and recently diagnosed with diabetes.   OB History    Grav Para Term Preterm Abortions TAB SAB Ect Mult Living   3 2 2  0 0 0 0 0 0 2      Past Medical History  Diagnosis Date  . No pertinent past medical history   . Gestational diabetes     2nd pregnancy    Past Surgical History  Procedure Date  . Cholecystectomy     History reviewed. No pertinent family history.  History  Substance Use Topics  . Smoking status: Never Smoker   . Smokeless tobacco: Not on file  . Alcohol Use: No    Allergies: No Known Allergies  Prescriptions prior to admission  Medication Sig Dispense Refill  . acetaminophen (TYLENOL) 500 MG tablet Take 500 mg by mouth every 6 (six) hours as needed. For pain or headache      . Prenatal Vit-Fe Fumarate-FA (PRENATAL MULTIVITAMIN) TABS Take 1 tablet by mouth every morning.         Review of Systems  Constitutional: Negative for fever.  Eyes: Negative for blurred vision.  Gastrointestinal: Negative for nausea and vomiting.  Neurological: Positive for headaches.   Physical Exam   Last menstrual period 05/07/2011.  Physical Exam  Constitutional: She is oriented to person, place, and time. She appears well-developed and well-nourished. No distress.  HENT:  Head: Normocephalic.  Cardiovascular: Normal rate.   Respiratory: Effort normal.  GI: Soft. She exhibits no distension. There is no tenderness.  Musculoskeletal: Normal range of motion. She exhibits no edema.  Neurological: She is alert and oriented to person, place, and time. She has normal reflexes. She displays normal reflexes.  Skin: Skin is warm and dry.    Psychiatric: She has a normal mood and affect.    MAU Course  Procedures  MDM Will give Fioricet and check CBG  Fioricet brought HA down to a 7/10, so IVF/decadron/benadryl/reglan were given with excellent results  Assessment and Plan  Plan per results of above.  Pt discharged in improved condition  [facility] 12/20/2011, 7:14 PM

## 2011-12-20 NOTE — MAU Provider Note (Signed)
Attestation of Attending Supervision of Advanced Practitioner: Evaluation and management procedures were performed by the [facility] Fellow/PA/CNM/NP under my supervision and collaboration. Chart reviewed, and agree with management and plan.  Jaynie Collins, M.D. 12/20/2011 7:24 PM

## 2011-12-20 NOTE — Discharge Instructions (Signed)
Cefaleas en general, sin causa  (Headache, General, Unknown Cause)  Puede ser que en el da de hoy no se haya determinado la causa especfica de su dolor de cabeza. Hay muchas causas y tipos de cefalea. Las ms frecuentes son:   Cefalea tensional   Migraa   Infecciones (por ejemplo: infecciones dentarias y en los senos)   Problemas en el hueso o en la articulacin del cuello o la mandbula.   Depresin   Problemas visuales  Estos tipos de cefalea no ponen en peligro su vida.   Este trastorno puede diagnosticarse a partir de la historia clnica y un examen fsico. En algunos casos se realizan estudios de laboratorio y por imgenes (como radiografas o tomografas computadas) para diagnosticar problemas ms graves. En algunos casos se indicar una puncin lumbar. En muchos casos los anlisis y las pruebas pueden ser normales en la primera visita y la causa de sus dolores de cabeza es un problema grave. Por eso es muy importante que concurra a los controles con su mdico o la clnica de su localidad para realizar nuevas evaluaciones.  AVERIGUE LOS RESULTADOS DE LAS PRUEBAS   Si le han tomado radiografas, un radilogo leer los resultados.   Ser contactado por el servicio de urgencias o por su mdico si los resultados de alguna prueba indican que debe realizar algn cambio en el plan de tratamiento.   Durante su visita puede ser que no cuente con todos los resultados de los anlisis. En este caso, tenga otra entrevista con su mdico para conocerlos. No piense que el resultado es normal si no tiene noticias de su mdico o de la institucin mdica. Es importante el seguimiento de todos los resultados de los anlisis.  INSTRUCCIONES PARA EL CUIDADO DOMICILIARIO   Concurra a los controles con su mdico o especialista.   Slo tome medicamentos de venta libre o prescriptos para calmar el dolor, las molestias, o bajar la fiebre segn las indicaciones de su mdico.   Las tcnicas de bioretroalimentacin, los  masajes y otras tcnicas de relajacin pueden ser de utilidad.   Pueden utilizarse bolsas de hielo o calor aplicadas a la cabeza o al cuello. Repita tres o cuatro veces por da, o cuando lo necesite.   Comunquese con su mdico si tiene preguntas o preocupaciones.   Si fuma, debe abandonar el hbito.  SOLICITE ATENCIN MDICA SI:   Presenta algn problema con el medicamento que le han prescrito.   No responde o no obtiene alivio de los medicamentos.   El dolor de cabeza que senta habitualmente es diferente.   Presenta nuseas o vmitos.  SOLICITE ATENCIN MDICA DE INMEDIATO SI:    El dolor de cabeza es cada vez ms intenso.   La temperatura oral se eleva sin motivo por encima de 102 F (38.9 C) o segn le indique el profesional que lo asiste.   Presenta rigidez en el cuello.   Sufre prdida de la visin.   Siente debilidad muscular.   Sufre prdida del control muscular.   Desarrolla sntomas graves diferentes de los primeros.   Comienza a perder el equilibrio o tiene problemas para caminar.   Se desmaya o pierde el conocimiento.  EST SEGURO QUE:    Comprende las instrucciones para el alta mdica.   Controlar su enfermedad.   Solicitar atencin mdica de inmediato segn las indicaciones.  Document Released: 05/23/2005 Document Revised: 08/02/2011  ExitCare Patient Information 2012 [employer].

## 2011-12-21 ENCOUNTER — Ambulatory Visit (HOSPITAL_COMMUNITY)
Admission: RE | Admit: 2011-12-21 | Discharge: 2011-12-21 | Disposition: A | Discharge status: Home / Self Care | Payer: Self-pay | Source: Ambulatory Visit | Attending: Obstetrics and Gynecology | Admitting: Obstetrics and Gynecology

## 2011-12-21 DIAGNOSIS — O24419 Gestational diabetes mellitus in pregnancy, unspecified control: Secondary | ICD-10-CM

## 2011-12-21 DIAGNOSIS — O99810 Abnormal glucose complicating pregnancy: Secondary | ICD-10-CM | POA: Insufficient documentation

## 2011-12-21 DIAGNOSIS — Z3689 Encounter for other specified antenatal screening: Secondary | ICD-10-CM | POA: Insufficient documentation

## 2011-12-21 NOTE — MAU Provider Note (Signed)
Attestation of Attending Supervision of Advanced Practitioner: Evaluation and management procedures were performed by the [facility] Fellow/PA/CNM/NP under my supervision and collaboration. Chart reviewed, and agree with management and plan.  Jaynie Collins, M.D. 12/21/2011 2:53 AM

## 2011-12-24 ENCOUNTER — Encounter: Payer: Self-pay | Attending: Obstetrics and Gynecology | Admitting: Dietician

## 2011-12-24 ENCOUNTER — Ambulatory Visit (INDEPENDENT_AMBULATORY_CARE_PROVIDER_SITE_OTHER): Payer: Self-pay | Admitting: Family

## 2011-12-24 VITALS — BP 114/76 | Temp 98.1°F | Wt 135.7 lb

## 2011-12-24 DIAGNOSIS — O0990 Supervision of high risk pregnancy, unspecified, unspecified trimester: Secondary | ICD-10-CM

## 2011-12-24 DIAGNOSIS — Z713 Dietary counseling and surveillance: Secondary | ICD-10-CM | POA: Insufficient documentation

## 2011-12-24 DIAGNOSIS — O99810 Abnormal glucose complicating pregnancy: Secondary | ICD-10-CM

## 2011-12-24 DIAGNOSIS — O24419 Gestational diabetes mellitus in pregnancy, unspecified control: Secondary | ICD-10-CM

## 2011-12-24 LAB — POCT URINALYSIS DIP (DEVICE)
Bilirubin Urine: NEGATIVE
Hgb urine dipstick: NEGATIVE
Ketones, ur: NEGATIVE mg/dL
Protein, ur: NEGATIVE mg/dL
Specific Gravity, Urine: 1.02 (ref 1.005–1.030)
pH: 6 (ref 5.0–8.0)

## 2011-12-24 MED ORDER — NITROFURANTOIN MONOHYD MACRO 100 MG PO CAPS: 100.0000 mg | ORAL_CAPSULE | Freq: Two times a day (BID) | ORAL | Status: AC

## 2011-12-24 MED ORDER — CYCLOBENZAPRINE HCL 10 MG PO TABS: 10.0000 mg | ORAL_TABLET | Freq: Three times a day (TID) | ORAL | Status: AC | PRN

## 2011-12-24 NOTE — Progress Notes (Signed)
Diabetes Education:  Presents with a history of GDM with last pregnancy 6 years ago when she saw Dr. Gaynell Face for care.  Review of the diet and blood glucose testing with the assistance of Raynelle Fanning the [place] interpreter.  Provided with a True Track Meter LOT: [id number] EXP:2013/03/26  1 box of Strips Lot: [id number] Exp: 2013/01/10 and 1 box lancetesLot: [id number] EXP. 2015/12/06.  Informed to bring meter and glucose log to all clinic appointments.  On return demonstration her fasting glucose level at 10:45 was 76 mg.  Idolina Primer, RN, RD, CDE

## 2011-12-24 NOTE — Progress Notes (Signed)
Does not have glucose log, states did not meet with Seward Grater last week, >will meet with [facility] today; reviewed importance of good blood control.  Growth [place] on 4/26 67%; no uti symptoms, reports lower back pain, difficult to sleep, unsure if having contractions, cervix - closed, no bleeding or leaking of fluid.  RX flexeril and macrobid, send urine for culture.  FBS today 67

## 2011-12-24 NOTE — Progress Notes (Signed)
Pulse 77. Patient reports a lot of vaginal pressure and irregular contractions.

## 2011-12-28 LAB — CULTURE, OB URINE: Colony Count: >=100000

## 2011-12-31 ENCOUNTER — Ambulatory Visit (INDEPENDENT_AMBULATORY_CARE_PROVIDER_SITE_OTHER): Payer: Self-pay | Admitting: Obstetrics and Gynecology

## 2011-12-31 VITALS — BP 101/75 | Temp 97.1°F | Wt 137.6 lb

## 2011-12-31 DIAGNOSIS — O0990 Supervision of high risk pregnancy, unspecified, unspecified trimester: Secondary | ICD-10-CM

## 2011-12-31 DIAGNOSIS — O99810 Abnormal glucose complicating pregnancy: Secondary | ICD-10-CM

## 2011-12-31 DIAGNOSIS — O09299 Supervision of pregnancy with other poor reproductive or obstetric history, unspecified trimester: Secondary | ICD-10-CM

## 2011-12-31 DIAGNOSIS — O24419 Gestational diabetes mellitus in pregnancy, unspecified control: Secondary | ICD-10-CM

## 2011-12-31 LAB — POCT URINALYSIS DIP (DEVICE)
Bilirubin Urine: NEGATIVE
Glucose, UA: NEGATIVE mg/dL
Ketones, ur: NEGATIVE mg/dL
Nitrite: NEGATIVE
pH: 6 (ref 5.0–8.0)

## 2011-12-31 NOTE — Progress Notes (Incomplete)
Diabetes Education:  C/o her meter registering higher than her sister's meter.  On checking her meter with my meter, there was a 15 mg difference in the fasting reading.  I checked a new meter with her meter and the new True Tack, JYN:[id number] Exp: 2012/12/24 was only 5 points different from my meter with the same drop of blood.  Maggie Doriana Mazurkiewicz, RN, RD, CDE

## 2011-12-31 NOTE — Progress Notes (Signed)
CBG f: 82-93 (1/7 abnormal) 2hr pB:79-189 (2/7 abnormal); pL 72-134 (2/7 abnormal); p D 102-148 (2/7 abnormal). Patient doing well without complaints

## 2011-12-31 NOTE — Progress Notes (Signed)
Edema-hands and feet. Pelvic pressure. Pulse 85.

## 2012-01-07 ENCOUNTER — Ambulatory Visit (INDEPENDENT_AMBULATORY_CARE_PROVIDER_SITE_OTHER): Payer: Self-pay | Admitting: Family Medicine

## 2012-01-07 VITALS — BP 115/84 | Temp 96.8°F | Wt 140.5 lb

## 2012-01-07 DIAGNOSIS — O99810 Abnormal glucose complicating pregnancy: Secondary | ICD-10-CM

## 2012-01-07 DIAGNOSIS — O24419 Gestational diabetes mellitus in pregnancy, unspecified control: Secondary | ICD-10-CM

## 2012-01-07 LAB — POCT URINALYSIS DIP (DEVICE)
Glucose, UA: NEGATIVE mg/dL
Ketones, ur: NEGATIVE mg/dL
Protein, ur: NEGATIVE mg/dL
Specific Gravity, Urine: 1.02 (ref 1.005–1.030)

## 2012-01-07 NOTE — Progress Notes (Signed)
Pulse 75 Patient needs antibody screen Patient feels that fetal movement has decreased some, but still feels baby move Patient reports some pelvic pressure and occasional contractions;

## 2012-01-07 NOTE — Patient Instructions (Signed)
Pregnancy - Third Trimester The third trimester of pregnancy (the last 3 months) is a period of the most rapid growth for you and your baby. The baby approaches a length of 20 inches and a weight of 6 to 10 pounds. The baby is adding on fat and getting ready for life outside your body. While inside, babies have periods of sleeping and waking, suck their thumbs, and hiccups. You can often feel small contractions of the uterus. This is false labor. It is also called Braxton-Hicks contractions. This is like a practice for labor. The usual problems in this stage of pregnancy include more difficulty breathing, swelling of the hands and feet from water retention, and having to urinate more often because of the uterus and baby pressing on your bladder.  PRENATAL EXAMS  Blood work may continue to be done during prenatal exams. These tests are done to check on your health and the probable health of your baby. Blood work is used to follow your blood levels (hemoglobin). Anemia (low hemoglobin) is common during pregnancy. Iron and vitamins are given to help prevent this. You may also continue to be checked for diabetes. Some of the past blood tests may be done again.   The size of the uterus is measured during each visit. This makes sure your baby is growing properly according to your pregnancy dates.   Your blood pressure is checked every prenatal visit. This is to make sure you are not getting toxemia.   Your urine is checked every prenatal visit for infection, diabetes and protein.   Your weight is checked at each visit. This is done to make sure gains are happening at the suggested rate and that you and your baby are growing normally.   Sometimes, an ultrasound is performed to confirm the position and the proper growth and development of the baby. This is a test done that bounces harmless sound waves off the baby so your caregiver can more accurately determine due dates.   Discuss the type of pain  medication and anesthesia you will have during your labor and delivery.   Discuss the possibility and anesthesia if a Cesarean Section might be necessary.   Inform your caregiver if there is any mental or physical violence at home.  Sometimes, a specialized non-stress test, contraction stress test and biophysical profile are done to make sure the baby is not having a problem. Checking the amniotic fluid surrounding the baby is called an amniocentesis. The amniotic fluid is removed by sticking a needle into the belly (abdomen). This is sometimes done near the end of pregnancy if an early delivery is required. In this case, it is done to help make sure the baby's lungs are mature enough for the baby to live outside of the womb. If the lungs are not mature and it is unsafe to deliver the baby, an injection of cortisone medication is given to the mother 1 to 2 days before the delivery. This helps the baby's lungs mature and makes it safer to deliver the baby. CHANGES OCCURING IN THE THIRD TRIMESTER OF PREGNANCY Your body goes through many changes during pregnancy. They vary from person to person. Talk to your caregiver about changes you notice and are concerned about.  During the last trimester, you have probably had an increase in your appetite. It is normal to have cravings for certain foods. This varies from person to person and pregnancy to pregnancy.   You may begin to get stretch marks on your hips,   abdomen, and breasts. These are normal changes in the body during pregnancy. There are no exercises or medications to take which prevent this change.   Constipation may be treated with a stool softener or adding bulk to your diet. Drinking lots of fluids, fiber in vegetables, fruits, and whole grains are helpful.   Exercising is also helpful. If you have been very active up until your pregnancy, most of these activities can be continued during your pregnancy. If you have been less active, it is helpful  to start an exercise program such as walking. Consult your caregiver before starting exercise programs.   Avoid all smoking, alcohol, un-prescribed drugs, herbs and "street drugs" during your pregnancy. These chemicals affect the formation and growth of the baby. Avoid chemicals throughout the pregnancy to ensure the delivery of a healthy infant.   Backache, varicose veins and hemorrhoids may develop or get worse.   You will tire more easily in the third trimester, which is normal.   The baby's movements may be stronger and more often.   You may become short of breath easily.   Your belly button may stick out.   A yellow discharge may leak from your breasts called colostrum.   You may have a bloody mucus discharge. This usually occurs a few days to a week before labor begins.  HOME CARE INSTRUCTIONS   Keep your caregiver's appointments. Follow your caregiver's instructions regarding medication use, exercise, and diet.   During pregnancy, you are providing food for you and your baby. Continue to eat regular, well-balanced meals. Choose foods such as meat, fish, milk and other low fat dairy products, vegetables, fruits, and whole-grain breads and cereals. Your caregiver will tell you of the ideal weight gain.   A physical sexual relationship may be continued throughout pregnancy if there are no other problems such as early (premature) leaking of amniotic fluid from the membranes, vaginal bleeding, or belly (abdominal) pain.   Exercise regularly if there are no restrictions. Check with your caregiver if you are unsure of the safety of your exercises. Greater weight gain will occur in the last 2 trimesters of pregnancy. Exercising helps:   Control your weight.   Get you in shape for labor and delivery.   You lose weight after you deliver.   Rest a lot with legs elevated, or as needed for leg cramps or low back pain.   Wear a good support or jogging bra for breast tenderness during  pregnancy. This may help if worn during sleep. Pads or tissues may be used in the bra if you are leaking colostrum.   Do not use hot tubs, steam rooms, or saunas.   Wear your seat belt when driving. This protects you and your baby if you are in an accident.   Avoid raw meat, cat litter boxes and soil used by cats. These carry germs that can cause birth defects in the baby.   It is easier to loose urine during pregnancy. Tightening up and strengthening the pelvic muscles will help with this problem. You can practice stopping your urination while you are going to the bathroom. These are the same muscles you need to strengthen. It is also the muscles you would use if you were trying to stop from passing gas. You can practice tightening these muscles up 10 times a set and repeating this about 3 times per day. Once you know what muscles to tighten up, do not perform these exercises during urination. It is more likely   to cause an infection by backing up the urine.   Ask for help if you have financial, counseling or nutritional needs during pregnancy. Your caregiver will be able to offer counseling for these needs as well as refer you for other special needs.   Make a list of emergency phone numbers and have them available.   Plan on getting help from family or friends when you go home from the hospital.   Make a trial run to the hospital.   Take prenatal classes with the father to understand, practice and ask questions about the labor and delivery.   Prepare the baby's room/nursery.   Do not travel out of the city unless it is absolutely necessary and with the advice of your caregiver.   Wear only low or no heal shoes to have better balance and prevent falling.  MEDICATIONS AND DRUG USE IN PREGNANCY  Take prenatal vitamins as directed. The vitamin should contain 1 milligram of folic acid. Keep all vitamins out of reach of children. Only a couple vitamins or tablets containing iron may be fatal  to a baby or young child when ingested.   Avoid use of all medications, including herbs, over-the-counter medications, not prescribed or suggested by your caregiver. Only take over-the-counter or prescription medicines for pain, discomfort, or fever as directed by your caregiver. Do not use aspirin, ibuprofen (Motrin, Advil, Nuprin) or naproxen (Aleve) unless OK'd by your caregiver.   Let your caregiver also know about herbs you may be using.   Alcohol is related to a number of birth defects. This includes fetal alcohol syndrome. All alcohol, in any form, should be avoided completely. Smoking will cause low birth rate and premature babies.   Street/illegal drugs are very harmful to the baby. They are absolutely forbidden. A baby born to an addicted mother will be addicted at birth. The baby will go through the same withdrawal an adult does.  SEEK MEDICAL CARE IF: You have any concerns or worries during your pregnancy. It is better to call with your questions if you feel they cannot wait, rather than worry about them. DECISIONS ABOUT CIRCUMCISION You may or may not know the sex of your baby. If you know your baby is a boy, it may be time to think about circumcision. Circumcision is the removal of the foreskin of the penis. This is the skin that covers the sensitive end of the penis. There is no proven medical need for this. Often this decision is made on what is popular at the time or based upon religious beliefs and social issues. You can discuss these issues with your caregiver or pediatrician. SEEK IMMEDIATE MEDICAL CARE IF:   An unexplained oral temperature above 102 F (38.9 C) develops, or as your caregiver suggests.   You have leaking of fluid from the vagina (birth canal). If leaking membranes are suspected, take your temperature and tell your caregiver of this when you call.   There is vaginal spotting, bleeding or passing clots. Tell your caregiver of the amount and how many pads are  used.   You develop a bad smelling vaginal discharge with a change in the color from clear to white.   You develop vomiting that lasts more than 24 hours.   You develop chills or fever.   You develop shortness of breath.   You develop burning on urination.   You loose more than 2 pounds of weight or gain more than 2 pounds of weight or as suggested by your   caregiver.   You notice sudden swelling of your face, hands, and feet or legs.   You develop belly (abdominal) pain. Round ligament discomfort is a common non-cancerous (benign) cause of abdominal pain in pregnancy. Your caregiver still must evaluate you.   You develop a severe headache that does not go away.   You develop visual problems, blurred or double vision.   If you have not felt your baby move for more than 1 hour. If you think the baby is not moving as much as usual, eat something with sugar in it and lie down on your left side for an hour. The baby should move at least 4 to 5 times per hour. Call right away if your baby moves less than that.   You fall, are in a car accident or any kind of trauma.   There is mental or physical violence at home.  Document Released: 08/07/2001 Document Revised: 08/02/2011 Document Reviewed: 02/09/2009 [employer] Patient Information 2012 [employer]. 

## 2012-01-07 NOTE — Progress Notes (Signed)
C/o differences between her CBG and her sisters machine, which is more accuate.  Fasting 2:7 high.  PP: 4:16 high.  No other complaints.

## 2012-01-10 ENCOUNTER — Encounter (HOSPITAL_COMMUNITY): Payer: Self-pay | Admitting: *Deleted

## 2012-01-10 ENCOUNTER — Inpatient Hospital Stay (HOSPITAL_COMMUNITY)
Admission: AD | Admit: 2012-01-10 | Discharge: 2012-01-10 | Disposition: A | Discharge status: Home / Self Care | Payer: Self-pay | Source: Ambulatory Visit | Attending: Obstetrics & Gynecology | Admitting: Obstetrics & Gynecology

## 2012-01-10 DIAGNOSIS — N949 Unspecified condition associated with female genital organs and menstrual cycle: Secondary | ICD-10-CM

## 2012-01-10 DIAGNOSIS — M545 Low back pain, unspecified: Secondary | ICD-10-CM | POA: Insufficient documentation

## 2012-01-10 DIAGNOSIS — M5430 Sciatica, unspecified side: Secondary | ICD-10-CM | POA: Insufficient documentation

## 2012-01-10 DIAGNOSIS — O24419 Gestational diabetes mellitus in pregnancy, unspecified control: Secondary | ICD-10-CM

## 2012-01-10 DIAGNOSIS — R1032 Left lower quadrant pain: Secondary | ICD-10-CM | POA: Insufficient documentation

## 2012-01-10 DIAGNOSIS — N390 Urinary tract infection, site not specified: Secondary | ICD-10-CM | POA: Insufficient documentation

## 2012-01-10 DIAGNOSIS — O0990 Supervision of high risk pregnancy, unspecified, unspecified trimester: Secondary | ICD-10-CM

## 2012-01-10 NOTE — MAU Provider Note (Signed)
History     CSN: [id number]  Arrival date and time: 01/10/12 2054   None     No chief complaint on file.  HPI Patient is a 26 y/o [id number] Hispanic female presents with LLQ pain, LBP, with pain radiating to  her  toes on her left side. Onset was yesterday. Describes it as sharp and exacerbated by walking. Occasional weakness in the LLL. Has had some burning, frequency, urgency of urination with white vaginal d/c. Denies trauma and vaginal bleeding.   OB History    Grav Para Term Preterm Abortions TAB SAB Ect Mult Living   3 2 2  0 0 0 0 0 0 2      Past Medical History  Diagnosis Date  . No pertinent past medical history   . Gestational diabetes     2nd pregnancy    Past Surgical History  Procedure Date  . Cholecystectomy     No family history on file.  History  Substance Use Topics  . Smoking status: Never Smoker   . Smokeless tobacco: Not on file  . Alcohol Use: No    Allergies: No Known Allergies  Prescriptions prior to admission  Medication Sig Dispense Refill  . acetaminophen (TYLENOL) 500 MG tablet Take 500 mg by mouth every 6 (six) hours as needed. For pain or headache      . cyclobenzaprine (FLEXERIL) 10 MG tablet Take 10 mg by mouth 3 (three) times daily as needed.      . fexofenadine (ALLEGRA) 30 MG tablet Take 30 mg by mouth 2 (two) times daily. allergies      . Prenatal Vit-Fe Fumarate-FA (PRENATAL MULTIVITAMIN) TABS Take 1 tablet by mouth every morning.         Review of Systems  Constitutional:       Occasional LLL weakness  Gastrointestinal: Positive for abdominal pain. Negative for blood in stool.       LLQ pain  Genitourinary: Positive for dysuria, urgency and frequency. Negative for hematuria and flank pain.  Musculoskeletal: Positive for back pain. Negative for falls.       LBP   Neurological: Positive for weakness. Negative for tingling and sensory change.   Physical Exam   Blood pressure 127/87, pulse 79, temperature 97.5 F (36.4 C),  temperature source Oral, resp. rate 18, height 4\' 10"  (1.473 m), weight 63.617 kg (140 lb 4 oz), last menstrual period 05/07/2011.  Physical Exam  Constitutional: She is oriented to person, place, and time. Vital signs are normal. She appears well-developed and well-nourished.  Non-toxic appearance. She does not appear ill. No distress.  HENT:  Head: Normocephalic and atraumatic.  Cardiovascular: Normal rate, regular rhythm, S1 normal, S2 normal, normal heart sounds and intact distal pulses.  Exam reveals no gallop and no friction rub.   No murmur heard. Respiratory: Effort normal. She has no decreased breath sounds. She has no wheezes. She has no rhonchi. She has no rales.  GI: Soft. Normal appearance. Bowel sounds are decreased. There is tenderness in the left lower quadrant. There is no rigidity and no guarding.  Neurological: She is alert and oriented to person, place, and time. She has normal strength. She displays no atrophy. She exhibits normal muscle tone. She displays no Babinski's sign on the right side. She displays no Babinski's sign on the left side.  Reflex Scores:      Patellar reflexes are 2+ on the right side and 2+ on the left side.      Achilles reflexes  are 2+ on the right side and 2+ on the left side. Skin: Skin is warm, dry and intact.    MAU Course  Procedures  MDM Round ligament pain  Sciatica  LBP  UTI   Assessment and Plan  Maternity support belt Comfort measures reviewed  Macrobid for UTI Flexeril PRN for pain  D/C home     [place], Jilda Panda 01/10/2012, 10:05 PM

## 2012-01-10 NOTE — MAU Note (Signed)
PT  INTERVIEWED WITH INTERPRETER- SILVA.   SAYS SHE HAS LOWER ABD PAIN AND PELVIC PRESSURE AND PAIN  IN L LEG THAT STARTED ON WED.    NO VE IN  CLINIC.

## 2012-01-10 NOTE — Discharge Instructions (Signed)
Infeccin del tracto urinario (Urinary Tract Infection) Las infecciones en el tracto urinario pueden comenzar en varios lugares. Una infeccin en la vejiga (cistitis), una infeccin en el rin (pielonefritis) o una infeccin en la prstata (prostatitis) son diferentes tipos de infeccin del tracto urinario. Por lo general mejoran si se los trata con antibiticos. Los antibiticos son medicamentos que matan grmenes. [employer] medicamentos que le han recetado hasta que se terminen. Podr sentirse bien dentro de [address], pero DEBE TOMAR LOS MEDICAMENTOS HASTA TERMINAR EL TRATAMIENTO, de lo contrario la infeccin puede no solucionarse y luego ser ms difcil de Warehouse manager. INSTRUCCIONES PARA EL CUIDADO DOMICILIARIO  Beba gran cantidad de lquidos para mantener la orina de tono claro o color amarillo plido. Se recomienda especialmente el jugo de arndanos rojos, adems de grandes cantidades de [place].   Evite la cafena, el t y las bebidas con gas. Estas sustancias irritan la vejiga.   El alcohol puede Teacher, adult education.   Utilice los medicamentos de venta libre o de prescripcin para Chief Technology Officer, Environmental health practitioner o la [place], segn se lo indique el profesional que lo asiste.  PARA PREVENIR FUTURAS INFECCIONES:  Vace la vejiga con frecuencia. Evite retener la orina durante largos perodos.   Despus de mover el intestino, las mujeres deben higienizarse la regin perineal desde adelante hacia atrs. Use cada papel tissue slo una vez.   Vace la vejiga antes y despus de Management consultant.  OBTENER LOS RESULTADOS DE LAS PRUEBAS Durante su visita no contar con todos los [employer]. En este caso, tenga otra entrevista con su mdico para conocerlos. No piense que el resultado es normal si no tiene noticias de su mdico o de la institucin mdica. Es Copy seguimiento de todos los [place].  SOLICITE ATENCIN MDICA SI:  Siente dolor en la espalda.    El beb tiene ms de 3 meses y su temperatura rectal es de 100.5 F (38.1 C) o ms durante ms de 1 da.   Los problemas (sntomas) no mejoran en 3 das. Solicite atencin mdica antes si empeora.  SOLICITE ATENCIN MDICA DE INMEDIATO SI:  Comienza a sentir un dolor de espaldas o en la zona abdominal inferior intenso.   Comienza a sentir escalofros.   Tiene fiebre.   Su beb tiene ms de 3 meses y su temperatura rectal es de 102 F (38.9 C) o mayor.   Su beb tiene 3 meses o menos y su temperatura rectal es de 100.4 F (38 C) o mayor.   Siente nuseas o vmitos.   Tiene una sensacin continua de quemazn o molestias al [employer].  EST SEGURO QUE:  Comprende las instrucciones para el alta mdica.   Controlar su enfermedad.   Solicitar atencin mdica de inmediato segn las indicaciones.  Document Released: 05/23/2005 Document Revised: 08/02/2011 [facility] Patient Information 2012 [place], [name].Citica (Sciatica) La citica refiere al dolor lumbar causado por la presin sobre el nervio citico. El dolor de espalda puede expandirse hacia las nalgas y la parte trasera de las piernas. CUIDADOS EN EL HOGAR   Descanse todo lo que pueda.   Slo tome medicamentos como lo indique su mdico.   Aplique fro o calor en la espalda segn las indicaciones del mdico.   No se arquee, levante pesos, ni se siente durante largos perodos de tiempo hasta que el dolor haya mejorado.   No haga nada que pueda empeorar el trastorno.   Retome las [address] normales cuando el  dolor haya mejorado.   Cumpla con las visitas de control tal como se le indic.  SOLICITE AYUDA DE INMEDIATO SI:   Siente mayor dolor.   Presenta debilidad o adormecimiento en las piernas.   No puede controlar la defecacin o la miccin (orinar).  ASEGRESE DE QUE:   Comprende estas instrucciones.   Controlar la enfermedad.   Solicitar ayuda de inmediato si usted o el nio no mejora o si empeora.   Document Released: 09/15/2010 Document Revised: 08/02/2011 [facility] Patient Information 2012 [place], [name].Dolor del ligamento redondo (Round Ligament Pain) El ligamento redondo se compone de msculo y tejido fibroso. Est unido al tero cerca de las trompas de [place] El ligamento redondo est ubicado en ambos lados del tero y [place] a Pharmacologist su posicin. Normalmente comienza en el segundo trimestre del embarazo cuando el tero sale hacia afuera de la pelvis. El dolor puede aparecer y desaparecer hasta el nacimiento del beb. El dolor de ligamento redondo no es un problema serio y no ocasiona daos al beb. CAUSAS Durante el Consulting civil engineer tero crece mayormente desde el segundo trimestre [place]. A medida que crece, se estira y tuerce ligeramente los ligamentos. Cuando el tero ejerce presin en ambos lados, el ligamento redondo del lado opuesto presiona y se Psychologist, occupational. Esto causa dolor. SNTOMAS El dolor puede ocurrir en uno o ambos lados. El dolor es por lo general como un pellizco corto y filoso. A veces puede ser un dolor opaco y persistente. Se siente en la parte baja del abdomen o en la ingle. Es un Adult nurse, y por lo general comienza en la ingle y se mueve hacia la zona de la cadera. El dolor puede ocurrir con:  Chief of Staff de posicin repentino como el levantarse de la cama o de una silla.   Darse vuelta en la cama.   Toser o estornudar.   Caminar demasiado.   Cualquier tipo de actividad fsica.  DIAGNSTICO El medico deber asegurarse de que no existen problemas graves que Audiological scientist. Si no encuentra nada grave, los sntomas suelen indicar de que se trata de un dolor proveniente del ligamento redondo. TRATAMIENTO  Sintese y reljese cuando el dolor comience.   Llevar las rodillas [employer].   Recustese sobre un lado con una almohada debajo del vientre (abdomen) y Oletta Lamas sus piernas.   Sintese en un bao caliente de 15 a 20 minutos o hasta que el  dolor desaparezca.  INSTRUCCIONES PARA EL CUIDADO DOMICILIARIO  Utilice los medicamentos de venta libre o de prescripcin para Chief Technology Officer, el malestar o la [place], segn se lo indique el profesional que lo asiste.   Sintese y pngase de pie lentamente.   Evite caminatas largas si le ocasionan dolor.   Detenga o disminuya las actividades fsicas si Sports administrator.  SOLICITE ATENCIN MDICA SI:  El dolor no desaparece con estas medidas.   Necesita que le prescriban medicamentos ms fuertes para Chief Technology Officer.   Desarrolla un dolor de espalda que no haba sentido antes junto con el de lado.  SOLICITE ATENCIN MDICA DE INMEDIATO SI:  La temperatura se eleva por encima de 102 F (38.9 C) o ms.   Siente contracciones uterinas.   Presenta hemorragia vaginal.   Presenta nuseas, diarrea o vmitos.   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  Document Released: 07/26/2008 Document Revised: 08/02/2011 [facility] Patient Information 2012 [place], [name].

## 2012-01-14 ENCOUNTER — Ambulatory Visit (INDEPENDENT_AMBULATORY_CARE_PROVIDER_SITE_OTHER): Payer: Self-pay | Admitting: Obstetrics & Gynecology

## 2012-01-14 VITALS — BP 116/83 | Temp 97.6°F | Wt 141.3 lb

## 2012-01-14 DIAGNOSIS — O99810 Abnormal glucose complicating pregnancy: Secondary | ICD-10-CM

## 2012-01-14 LAB — POCT URINALYSIS DIP (DEVICE)
Bilirubin Urine: NEGATIVE
Glucose, UA: NEGATIVE mg/dL
Nitrite: NEGATIVE
Specific Gravity, Urine: 1.025 (ref 1.005–1.030)
Urobilinogen, UA: 1 mg/dL (ref 0.0–1.0)

## 2012-01-14 LAB — GLUCOSE, CAPILLARY: Glucose-Capillary: 111 mg/dL — ABNORMAL HIGH (ref 70–99)

## 2012-01-14 NOTE — Progress Notes (Signed)
Pulse 75. Edema feet and hands. Pelvic pressure and pain in legs.

## 2012-01-14 NOTE — Progress Notes (Signed)
Fastings 70-80s, 2 hr after breakfast 85-152, 2 hr after lunch 76-177, 2 hr after dinner 102-187   Our machine--111, pts machine--99 (after breakfast).  About 75% of CBGs are WNL.  Cultures today.

## 2012-01-15 LAB — GC/CHLAMYDIA PROBE AMP, URINE: Chlamydia, Swab/Urine, PCR: NEGATIVE

## 2012-01-17 LAB — CULTURE, BETA STREP (GROUP B ONLY)

## 2012-01-24 ENCOUNTER — Ambulatory Visit (INDEPENDENT_AMBULATORY_CARE_PROVIDER_SITE_OTHER): Payer: Self-pay | Admitting: Family Medicine

## 2012-01-24 ENCOUNTER — Encounter (HOSPITAL_COMMUNITY): Payer: Self-pay

## 2012-01-24 ENCOUNTER — Inpatient Hospital Stay (HOSPITAL_COMMUNITY)
Admission: AD | Admit: 2012-01-24 | Discharge: 2012-01-25 | DRG: 781 | Disposition: A | Discharge status: Home / Self Care | Payer: Medicaid Other | Source: Ambulatory Visit | Attending: Obstetrics & Gynecology | Admitting: Obstetrics & Gynecology

## 2012-01-24 ENCOUNTER — Encounter: Payer: Self-pay | Admitting: Family Medicine

## 2012-01-24 VITALS — BP 132/94 | Wt 143.6 lb

## 2012-01-24 DIAGNOSIS — O0990 Supervision of high risk pregnancy, unspecified, unspecified trimester: Secondary | ICD-10-CM

## 2012-01-24 DIAGNOSIS — O24419 Gestational diabetes mellitus in pregnancy, unspecified control: Secondary | ICD-10-CM

## 2012-01-24 DIAGNOSIS — O99810 Abnormal glucose complicating pregnancy: Principal | ICD-10-CM | POA: Diagnosis present

## 2012-01-24 DIAGNOSIS — O219 Vomiting of pregnancy, unspecified: Secondary | ICD-10-CM

## 2012-01-24 DIAGNOSIS — O09299 Supervision of pregnancy with other poor reproductive or obstetric history, unspecified trimester: Secondary | ICD-10-CM

## 2012-01-24 DIAGNOSIS — O169 Unspecified maternal hypertension, unspecified trimester: Secondary | ICD-10-CM

## 2012-01-24 DIAGNOSIS — O479 False labor, unspecified: Secondary | ICD-10-CM | POA: Diagnosis present

## 2012-01-24 DIAGNOSIS — O139 Gestational [pregnancy-induced] hypertension without significant proteinuria, unspecified trimester: Secondary | ICD-10-CM

## 2012-01-24 DIAGNOSIS — L299 Pruritus, unspecified: Secondary | ICD-10-CM

## 2012-01-24 LAB — COMPREHENSIVE METABOLIC PANEL
Alkaline Phosphatase: 174 U/L — ABNORMAL HIGH (ref 39–117)
BUN: 6 mg/dL (ref 6–23)
Creat: 0.54 mg/dL (ref 0.50–1.10)
Glucose, Bld: 118 mg/dL — ABNORMAL HIGH (ref 70–99)
Total Bilirubin: 0.4 mg/dL (ref 0.3–1.2)

## 2012-01-24 LAB — POCT URINALYSIS DIP (DEVICE)
Glucose, UA: NEGATIVE mg/dL
Hgb urine dipstick: NEGATIVE
Nitrite: NEGATIVE
Urobilinogen, UA: 1 mg/dL (ref 0.0–1.0)
pH: 6.5 (ref 5.0–8.0)

## 2012-01-24 LAB — CBC
HCT: 34.9 % — ABNORMAL LOW (ref 36.0–46.0)
Hemoglobin: 11.7 g/dL — ABNORMAL LOW (ref 12.0–15.0)
Hemoglobin: 11.7 g/dL — ABNORMAL LOW (ref 12.0–15.0)
MCH: 29.9 pg (ref 26.0–34.0)
MCHC: 33.5 g/dL (ref 30.0–36.0)
MCHC: 32.7 g/dL (ref 30.0–36.0)
RBC: 3.96 MIL/uL (ref 3.87–5.11)
WBC: 9.9 10*3/uL (ref 4.0–10.5)

## 2012-01-24 MED ORDER — LACTATED RINGERS IV SOLN: 500.0000 mL | INTRAVENOUS | Status: DC | PRN

## 2012-01-24 MED ORDER — NALBUPHINE SYRINGE 5 MG/0.5 ML: 10.0000 mg | INJECTION | INTRAMUSCULAR | Status: DC | PRN

## 2012-01-24 MED ORDER — CITRIC ACID-SODIUM CITRATE 334-500 MG/5ML PO SOLN: 30.0000 mL | ORAL | Status: DC | PRN

## 2012-01-24 MED ORDER — LIDOCAINE HCL (PF) 1 % IJ SOLN: 30.0000 mL | INTRAMUSCULAR | Status: DC | PRN

## 2012-01-24 MED ORDER — ACETAMINOPHEN 325 MG PO TABS: 650.0000 mg | ORAL_TABLET | ORAL | Status: DC | PRN

## 2012-01-24 MED ORDER — ONDANSETRON HCL 4 MG/2ML IJ SOLN: 4.0000 mg | Freq: Four times a day (QID) | INTRAMUSCULAR | Status: DC | PRN

## 2012-01-24 MED ORDER — FLEET ENEMA 7-19 GM/118ML RE ENEM: 1.0000 | ENEMA | RECTAL | Status: DC | PRN

## 2012-01-24 MED ORDER — OXYTOCIN 20 UNITS IN LACTATED RINGERS INFUSION - SIMPLE: 125.0000 mL/h | Freq: Once | INTRAVENOUS | Status: DC

## 2012-01-24 MED ORDER — OXYCODONE-ACETAMINOPHEN 5-325 MG PO TABS: 1.0000 | ORAL_TABLET | ORAL | Status: DC | PRN

## 2012-01-24 MED ORDER — OXYTOCIN BOLUS FROM INFUSION
500.0000 mL | Freq: Once | INTRAVENOUS | Status: DC
  Filled 2012-01-24: qty 500

## 2012-01-24 MED ORDER — LACTATED RINGERS IV SOLN: INTRAVENOUS | Status: DC

## 2012-01-24 MED ORDER — IBUPROFEN 600 MG PO TABS: 600.0000 mg | ORAL_TABLET | Freq: Four times a day (QID) | ORAL | Status: DC | PRN

## 2012-01-24 NOTE — MAU Note (Signed)
Patient states she is having contractions every 6-8 minutes. Reports good movement, no bleeding with a little leaking.

## 2012-01-24 NOTE — H&P (Signed)
History    CSN: [id number]  Arrival date and time: 01/24/12 1522  None  Chief Complaint   Patient presents with   .  Labor Eval    HPI  Having contractions every 8, then every 6, then every 5 minutes. When she presented to the clinic earlier this morning, they were about 8 minutes apart. At re-presentation, they were more frequent, about every 5 minutes apart, and are also more pressure. They are also lasting longer.  No vaginal bleeding. She did have a gush of fluid which soaked through her pants yesterday in the afternoon. She has had another episode this morning significant enough to go through her clothes. Fluid appears colorless. Has a strange smell which does not smell like urine.  No nausea, vomiting, last bowel movement this morning.  OB History    Grav  Para  Term  Preterm  Abortions  TAB  SAB  Ect  Mult  Living    3  2  2   0  0  0  0  0  0  2      Past Medical History   Diagnosis  Date   .  No pertinent past medical history    .  Gestational diabetes      2nd pregnancy    Past Surgical History   Procedure  Date   .  Cholecystectomy     History reviewed. No pertinent family history.  History   Substance Use Topics   .  Smoking status:  Never Smoker   .  Smokeless tobacco:  Not on file   .  Alcohol Use:  No    Allergies: No Known Allergies  Prescriptions prior to admission   Medication  Sig  Dispense  Refill   .  acetaminophen (TYLENOL) 500 MG tablet  Take 500 mg by mouth every 6 (six) hours as needed. For pain or headache     .  cyclobenzaprine (FLEXERIL) 10 MG tablet  Take 10 mg by mouth 3 (three) times daily as needed.     .  fexofenadine (ALLEGRA) 30 MG tablet  Take 30 mg by mouth 2 (two) times daily. allergies     .  Prenatal Vit-Fe Fumarate-FA (PRENATAL MULTIVITAMIN) TABS  Take 1 tablet by mouth every morning.      Review of Systems  Constitutional: Negative for fever and chills.  Respiratory: Negative for cough and shortness of breath.  Cardiovascular:  Negative for chest pain.  Gastrointestinal: Negative for nausea, vomiting and constipation.  Genitourinary: Positive for dysuria.  Vaginal itching  Musculoskeletal: Positive for back pain (With contractions). Negative for falls.  Neurological: Negative for dizziness, seizures and loss of consciousness.  Endo/Heme/Allergies: Does not bruise/bleed easily.  Psychiatric/Behavioral: Negative for depression. The patient is not nervous/anxious.   Physical Exam   Blood pressure 122/87, pulse 91, temperature 98.3 F (36.8 C), temperature source Oral, resp. rate 18, height 4' 10.5" (1.486 m), weight 65.137 kg (143 lb 9.6 oz), last menstrual period 05/07/2011.  Physical Exam  Constitutional: She is oriented to person, place, and time. She appears well-developed and well-nourished. No distress.  HENT:  Head: Normocephalic and atraumatic.  Eyes: Conjunctivae are normal. Right eye exhibits no discharge. Left eye exhibits no discharge. No scleral icterus.  Neck: No tracheal deviation present.  Cardiovascular: Normal rate, regular rhythm and normal heart sounds.  Respiratory: Effort normal and breath sounds normal. No stridor.  GI: Soft. She exhibits no distension. There is no tenderness. There is no rebound and  no guarding.  Gravid uterus, appropriate for gestational age.  Neurological: She is alert and oriented to person, place, and time. She has normal reflexes.  Skin: Skin is warm and dry. She is not diaphoretic.  Psychiatric: She has a normal mood and affect. Her behavior is normal. Judgment and thought content normal.   FHR 135 with moderate variability, positive accels, negative decels Ctx Q 4 minutes  MAU Course   Procedures: None  During her stay in the MAU, her cervical exam changed over 2 hours as documented in the nursing notes.   Assessment and Plan   26 year old pregnant female in labor.  Admit to labor floor. Prenatal labs:  Blood type: A pos Antibody screen: unknown Rubella:  immune RPR: Nonreactive HIV: Nonreactive Hep B: neg GBS: neg on 01/14/12   Patient would like epidural. Patient has gestational DM, diet controlled.  Last [place] normal.  Discussed with Sharen Counter who agrees with the above plan.     I have seen this patient and agree with the above resident's note.  LEFTWICH-KIRBY, [place] Paiz Certified Nurse-[name]

## 2012-01-24 NOTE — Progress Notes (Signed)
P=90,  Used Interpreter [employer], c/o edema feet, hands, and face that is worse in feet and hurts sometimes feels like they are going to explode,  States since 5am has been having regular contractions- were 8 minutes apart, then more often- feeling baby ball up since yesterday,

## 2012-01-24 NOTE — Patient Instructions (Addendum)
Preeclampsia y eclampsia (Preeclampsia and Eclampsia) La preeclampsia es un trastorno por el cual hay un aumento de la presin arterial durante el embarazo. Ocurre despus de la 20a. semana de gestacin. Si se produce durante la segunda mitad del Psychiatrist, y no hay otros sntomas, se denomina hipertensin gestacional y desaparece luego que el beb nace. Si junto a la hipertensin gestacional se desarrolla alguno de los sntomas que se enumeran ms abajo, el trastorno se denomina preeclampsia. La eclampsia (convulsiones) puede seguir a la preeclampsia. sta es una de las razones por las que deben realizarse controles prenatales. Es muy importante realizar un diagnstico y un tratamiento precoz para la prevencin. CAUSAS No existe una causa conocida para este problema. Existen algunos problemas que pueden poner a la mujer embarazada en riesgo de sufrirlo. Ellos son:  Engineer, agricultural.   Haber sufrido preeclampsia en embarazos anteriores.   Sufrir hipertensin crnica.   Si se trata de un embarazo mltiple (mellizos, trillizos).   Tener 35 aos o ms.   Pertenecer a la Public affairs consultant.   Sufrir problemas renales o ser diabtica.   Sufrir enfermedades como lupus o problemas sanguneos.   Tener sobrepeso (ser Maryjane Hurter).  SNTOMAS  Presin arterial elevada.   Dolor de cabeza   Aumento de peso sbito.   Hinchazn de manos, rostro, piernas y pies   Protenas en la orina.   Nuseas y vmitos   Problemas visuales (visin doble o borrosa).   Adormecimiento del rostro, brazos, piernas y pies.   Mareos.   Habla arrastrando las palabras.   La preeclampsia puede causar un retraso en la maduracin del feto.   Separacin (abrupcin) de la placenta.   No hay suficiente lquido en el saco amnitico (oligohidramnios).   Sensibilidad a la luz brillante.   Dolor abdominal.  DIAGNSTICO Si se descubren protenas en la orina durante la segunda mitad del [place], esto se considera  preeclampsia. Tambin puede haber otros de los sntomas ya mencionados. TRATAMIENTO Es necesario Pensions consultant.   El profesional que la asiste podr prescribirle reposo en cama en las primeras etapas de la enfermedad. Mucho reposo y restriccin de sal puede ser todo lo que necesite.   Si no responde a los tratamientos ms conservadores, podr ser necesaria la administracin de medicamentos.   En los casos graves, podr ser necesaria la hospitalizacin.   Para tratar la hipertensin.   Para controlar la retencin de lquidos.   Para verificar que el beb no sufra ningn dao.   La hospitalizacin es el mejor modo de tratar los primeros signos de preeclampsia. [employer], la mam y el beb pueden ser observados cuidadosamente y los anlisis de sangre se realizarn de manera ms efectiva y precisa.   Si el trastorno se hace ms grave, podr ser necesario inducir el parto o practicar una cesrea (extraccin del beb por medios quirrgicos). El mejor tratamiento para la preeclampsia/eclampsia es el [place].  La preeclampsia y la eclampsia implican riesgos para la madre y el beb. El profesional lo comentar con usted.. Juntos pueden elaborar la mejor manera de abordar sus problemas.  INSTRUCCIONES PARA EL CUIDADO DOMICILIARIO  [place]umpla con las citas y los anlisis prenatales tal como se le indic.   Consulte con el mdico si tiene alguno de [employer] Brands.   Descanse y duerma lo suficiente.   Consuma una dieta balanceada, baja en sal, y no agregue sal a la comida.   Evite las situaciones estresantes.   Slo tome medicamentos de [employer]  o de prescripcin para el dolor, el malestar o la [place], segn le haya indicado el mdico.  SOLICITE ATENCIN MDICA DE INMEDIATO SI:  Observa que se hincha alguna zona del cuerpo, generalmente las piernas.   Aumenta 5 libras (2,3 Kg) o ms en una semana.   Presenta una cefalea intensa, mareos, problemas visuales o confusin.   Tiene  dolor en el abdomen, nuseas o vmitos.   Sufre convulsiones.   Tiene dificultad para mover cualquier parte del cuerpo, siente adormecimiento o tiene dificultad para hablar.   Observa un hematoma o una hemorragia anormal.   Aparece rigidez en el cuello.   Vomita.  ASEGRESE QUE:  Comprende estas instrucciones.   Controlar su enfermedad.   Solicitar ayuda inmediatamente si no mejora o si empeora.  Document Released: 05/23/2005 Document Revised: 08/02/2011 [facility] Patient Information 2012 [place], [name].Ihor Dow y parto normal (Normal Labor and Delivery) Licensed conveyancer, su mdico debe estar seguro de que usted est en trabajo de [place]. Algunos signos son:  Puede haber eliminado el "tapn mucoso" antes que comience el trabajo de [place]. Se trata de una pequea cantidad de mucus con sangre.   Tiene contracciones uterinas regulares.   El Bank of America las contracciones se acorta.   Las molestias y Chief Technology Officer se hacen gradualmente ms intensos.   El dolor se ubica principalmente en la espalda.   Los dolores empeoran al [employer].   El cuello del tero (la apertura del tero se hace ms delgada, comienza a borrarse, y se abre (se dilata).  Una vez que se encuentre en [place] parto y sea admitida en el hospital, el mdico har lo siguiente:  Un examen fsico completo.   Controlar sus signos vitales (presin arterial, pulso, temperatura y la frecuencia cardaca fetal).   Realizar un examen vaginal (usando un guante estril y lubricante para determinar:   La posicin (presentacin) del beb (ceflica [vertex] o nalgas primero).   El nivel (plano) de la cabeza del beb en el canal de parto.   El borramiento y dilatacin del cuello del tero.   Le rasurarn el vello pbico y le aplicarn una enema segn lo considere el mdico y las circunstancias.   Generalmente se coloca un monitor electrnico sobre el abdomen. El monitor sigue la duracin e intensidad de las  contracciones, as como la frecuencia cardaca del beb.   Generalmente, el profesional inserta una va intravenosa en el brazo para administrarle agua azucarada. Esta es una medida de precaucin, de modo que puedan administrarle rpidamente medicamentos durante el [place].  EL TRABAJO DE PARTO Y PARTO NORMALES SE DIVIDEN EN 3 ETAPAS: Primera etapa Comienzan las contracciones regulares y el cuello comienza a borrarse y dilatarse. Esta etapa puede durar entre 3 y 15 horas. El final de la primera etapa se considera cuando el cuello est borrado en un 100% y se ha dilatado 10 cm. Le administrarn analgsicos por:  Inyeccin (morfina, demerol, etc.).   Anestesia regional (espinal, caudal o epidural, anestsicos colocados en diferentes regiones de la columna vertebral). Podrn administrarle medicamentos para el dolor en la regin paracervical, que consiste en la aplicacin de un anestsico inyectable en cada uno de los lados del cuello del tero.  La embarazada puede requerir un "parto natural" , es decir no recibir United Parcel o anestesia durante el [place] de parto y [place]. Segunda etapa En este momento el beb baja a travs del canal de parto (vagina) y nace. Esto puede durar entre 1 y 4  horas. A medida que el beb asoma la cabeza por el canal de parto, podr sentir una sensacin similar a cuando mueve el intestino. Sentir el impulse de empujar con fuerza hasta que el nio salga. A medida que la cabecita baja, el mdico decidir si realiza una episiotoma (corte en el perineo y rea de la vagina) para evitar la ruptura de los tejidos). Luego del nacimiento del beb y la expulsin de la placenta, la episiotoma se sutura. En algunos casos se coloca a la madre una mscara con xido nitroso para Research officer, political party respiracin y Engineer, materials. El final de la etapa 2 se produce cuando el beb ha salido completamente. Luego, cuando el cordn umbilical deja de pulsar, se pinza y se corta. Tercera  etapa La tercera etapa comienza luego que el beb ha nacido y finaliza luego de la expulsin de la placenta. Generalmente esto lleva entre 5 y 30 minutos. Luego de la expulsin de la placenta, le aplicarn un medicamento por va intravenosa para ayudar a Engineer, materials y Psychiatric nurse. En la tercera etapa no hay dolor y generalmente no son necesarios los analgsicos. Si le han realizado una episiotoma, es el momento de Sales promotion account executive. Luego del parto, la mam es observada y controlada exhaustivamente durante 1  2 horas para verificar que no hay sangrado en el post parto (hemorragias). Si pierde [employer], le administrarn un medicamento para Engineer, manufacturing tero y Comptroller. Document Released: 07/26/2008 Document Revised: 08/02/2011 [facility] Patient Information 2012 [place], [name]. Amamantar al beb (Breastfeeding) LOS BENEFICIOS DE AMAMANTAR Para el beb  La primera leche (calostro ) ayuda al mejor funcionamiento del sistema digestivo del beb.   La leche tiene anticuerpos que provienen de la madre y que ayudan a prevenir las infecciones en el beb.   Hay una menor incidencia de asma, enfermedades alrgicas y SMSI (sndrome de muerte sbita nfantil).   Los nutrientes que contiene la [place] materna son mejores que las frmulas para el bibern y favorecen el desarrollo cerebral.   Los bebs amamantados sufren menos gases, clicos y constipacin.  Para la mam  La lactancia materna favorece el desarrollo de un vnculo muy especial entre la madre y el beb.   Es ms conveniente, siempre disponible a la Optician, dispensing y ms econmica que la [employer].   Consume caloras en la madre y la ayuda a perder el peso ganado durante el [place].   Favorece la contraccin del tero a su tamao normal, de manera ms rpida y [employer] las hemorragias luego del [place].   Las [employer].C. Holdings que amamantan tienen menor riesgo de Geophysical data processor de mama.  AMAMNTELO CON  FRECUENCIA  Un beb sano, nacido a trmino, puede amamantarse con tanta frecuencia como cada hora, o espaciar las comidas cada tres horas.   Esta frecuencia variar de un beb a otro. Observe al beb cuando manifieste signos de hambre, antes que regirse por el reloj.   Amamntelo tan seguido como el beb lo solicite, o cuando usted sienta la necesidad de Paramedic sus [place].   Despierte al beb si han pasado 3  4 horas desde la ltima comida.   El amamantamiento frecuente la ayudar a producir ms [place] y a Education officer, community de Engineer, mining en los pezones e hinchazn de las [place].  LA POSICIN DEL BEB PARA AMAMANTARLO  Ya sea que se encuentre acostada o sentada, asegrese que el abdomen del beb enfrente el suyo.   Sostenga la mama con el pulgar por [place] y [place]  resto de los dedos por debajo. Asegrese que sus dedos se encuentren lejos del pezn y de la boca del beb.   Toque suavemente los labios del beb y la mejilla ms cercana a la mama con el dedo o el pezn.   Cuando la boca del beb se abra lo suficiente, introduzca el pezn y la zona oscura que lo rodea tanto como le sea posible dentro de la boca.   Coloque a beb cerca suyo de modo que su nariz y mejillas toquen las mamas al [employer].  LAS COMIDAS  La duracin de cada comida vara de un beb a otro y de [place] comida a [place].   El beb debe succionar alrededor American Financial o tres minutos para que le llegue [place]. Esto se denomina "bajada". Por este motivo, permita que el nio se alimente en cada mama todo lo que desee. Terminar de mamar cuando haya recibido la cantidad [place] de nutrientes.   Para detener la succin coloque su dedo en la comisura de la boca del nio y [name] entre sus encas antes de quitarle la mama de la boca. Esto la ayudar a English as a second language teacher.  REDUCIR LA CONGESTIN DE LAS MAMAS  Durante la primera semana despus del parto, usted puede experimentar Monsanto Company. Cuando las mamas estn  congestionadas, se sienten calientes, llenas y molestas al tacto. Puede reducir la congestin si:   Lo amamanta frecuentemente, cada 2-3 horas. Las mams que [employer]W Corporation pronto y con frecuencia tienen menos problemas de [place].   Coloque bolsas fras livianas entre cada [place]. Esto ayuda a Building services engineer. Envuelva las bolsas de hielo en una toalla liviana para proteger su piel.   Aplique compresas hmedas calientes Wm[employer] W[employer]ley Jr. Company durante 5 a 10 minutos antes de amamantar al [employer]. Esto aumenta la circulacin y [place] a que la [place].   Masajee suavemente la mama antes y Psychologist, sport and exercise.   Asegrese que el nio vaca al menos una mama antes de cambiar de lado.   Use un sacaleche para vaciar la mama si el beb se duerme o no se alimenta bien. Tambin podr [employer] con esta bomba si tiene que volver al trabajo o siente que las mamas estn congestionadas.   Evite los biberones, chupetes o complementar la alimentacin con agua o jugos en lugar de la [place].   Verifique que el beb se encuentra en la posicin correcta mientras lo alimenta.   Evite el cansancio, el estrs y la anemia   Use un soutien que sostenga bien sus mamas y evite los que tienen aro.   Consuma una dieta balanceada y beba lquidos en cantidad.  Si sigue estas indicaciones, la congestin debe mejorar en 24 a 48 horas. Si an tiene dificultades, consulte a Barista. TENDR SUFICIENTE LECHE MI BEB? Algunas veces las madres se preocupan acerca de si sus bebs tendrn la leche suficiente. Puede asegurarse que el beb tiene la leche suficiente si:  El beb succiona y escucha que traga activamente.   El nio se alimenta al menos 8 a 12 veces en 24 horas. Alimntelo hasta que se desprenda por sus propios medios o se quede dormido en la primera mama (al menos durante 10 a 20 minutos), luego ofrzcale el otro lado.   El beb moja 5 a 6 paales descartables (6 a 8 paales de tela)  en 24 horas cuando tiene 5  6 das de vida.   Tiene al Lowe's Companies  2-3 deposiciones todos los B[employer]on, [employer] and Company primeros meses. La leche materna es todo el alimento que el beb necesita. No es necesario que el nio ingiera agua o preparados de bibern. De hecho, para ayudar a que sus mamas produzcan ms [place], lo mejor es no darle al beb suplementos durante las primeras semanas.   La materia fecal debe ser blanda y [place].   El beb debe aumentar 112 a 196 g por semana.  CUDESE Cuide sus mamas del siguiente modo:  Bese o dchese diariamente.   No lave sus pezones con jabn.   Comience a amamantar del lado izquierdo en una comida y del lado derecho en la siguiente.   Notar que [employer] suministro de [place] a los 2 a 5 [address]  [address] despus del [place]. Puede sentir algunas molestias por la congestin, lo que hace que sus mamas estn duras y sensibles. La congestin disminuye en 24 a 48 horas. Mientras tanto, aplique toallas hmedas calientes durante 5 a 10 minutos antes de amamantar. Un masaje suave y la extraccin de un poco de leche antes de Museum/gallery exhibitions officer ablandarn las mamas y har ms fcil que el beb se agarre. Use un buen sostn y seque al aire los pezones durante 10 a 15 minutos luego de cada alimentacin.   Solo utilice apsitos de algodn.   Utilice lanolina [employer] International pezones luego de [place]. No necesita lavarlos luego de alimentar al [employer].  Cudese del siguiente modo:   Consuma alimentos bien balanceados y refrigerios nutritivos.   Dixie Dials, jugos de fruta y agua para Warehouse manager sed (alrededor de 8 vasos por Futures trader).   Descanse lo suficiente.   Aumente la ingesta de calcio en la dieta (1200mg /da).   Evite los alimentos que usted nota que puedan afectar al beb.  SOLICITE ATENCIN MDICA SI:  Tiene preguntas que formular o dificultades con la alimentacin a pecho.   Necesita ayuda.   Observa una zona dura, roja y que le duele en la zona de la mama, y se acompaa de fiebre  de 100.5 F (38.1 C) o ms.   El beb est muy somnoliento como para alimentarse bien o tiene problemas para dormir.   El beb moja menos de 6 paales por da, a partir de los [address] de [place].   La piel del beb o la parte blanca de sus ojos est ms amarilla de lo que estaba en el hospital.   Se siente deprimida.  Document Released: 08/13/2005 Document Revised: 08/02/2011 [facility] Patient Information 2012 [place], [name].

## 2012-01-24 NOTE — MAU Provider Note (Signed)
History     CSN: [id number]  Arrival date and time: 01/24/12 1522   None     Chief Complaint  Patient presents with  . Labor Eval   HPI  Having contractions every 8, then every 6, then every 5 minutes.  When she presented to the clinic earlier this morning, they were about 8 minutes apart.  At re-presentation, they were more frequent, about every 5 minutes apart, and are also more pressure.  They are also lasting longer.   No vaginal bleeding.  She did have a gush of fluid which soaked through her pants yesterday in the afternoon.  She has had another episode this morning significant enough to go through her clothes.  Fluid appears colorless. Has a strange smell which does not smell like urine.  No nausea, vomiting, last bowel movement this morning.  OB History    Grav Para Term Preterm Abortions TAB SAB Ect Mult Living   3 2 2  0 0 0 0 0 0 2      Past Medical History  Diagnosis Date  . No pertinent past medical history   . Gestational diabetes     2nd pregnancy    Past Surgical History  Procedure Date  . Cholecystectomy     History reviewed. No pertinent family history.  History  Substance Use Topics  . Smoking status: Never Smoker   . Smokeless tobacco: Not on file  . Alcohol Use: No    Allergies: No Known Allergies  Prescriptions prior to admission  Medication Sig Dispense Refill  . acetaminophen (TYLENOL) 500 MG tablet Take 500 mg by mouth every 6 (six) hours as needed. For pain or headache      . cyclobenzaprine (FLEXERIL) 10 MG tablet Take 10 mg by mouth 3 (three) times daily as needed.      . fexofenadine (ALLEGRA) 30 MG tablet Take 30 mg by mouth 2 (two) times daily. allergies      . Prenatal Vit-Fe Fumarate-FA (PRENATAL MULTIVITAMIN) TABS Take 1 tablet by mouth every morning.         Review of Systems  Constitutional: Negative for fever and chills.  Respiratory: Negative for cough and shortness of breath.   Cardiovascular: Negative for chest  pain.  Gastrointestinal: Negative for nausea, vomiting and constipation.  Genitourinary: Positive for dysuria.       Vaginal itching  Musculoskeletal: Positive for back pain (With contractions). Negative for falls.  Neurological: Negative for dizziness, seizures and loss of consciousness.  Endo/Heme/Allergies: Does not bruise/bleed easily.  Psychiatric/Behavioral: Negative for depression. The patient is not nervous/anxious.    Physical Exam   Blood pressure 122/87, pulse 91, temperature 98.3 F (36.8 C), temperature source Oral, resp. rate 18, height 4' 10.5" (1.486 m), weight 65.137 kg (143 lb 9.6 oz), last menstrual period 05/07/2011.  Physical Exam  Constitutional: She is oriented to person, place, and time. She appears well-developed and well-nourished. No distress.  HENT:  Head: Normocephalic and atraumatic.  Eyes: Conjunctivae are normal. Right eye exhibits no discharge. Left eye exhibits no discharge. No scleral icterus.  Neck: No tracheal deviation present.  Cardiovascular: Normal rate, regular rhythm and normal heart sounds.   Respiratory: Effort normal and breath sounds normal. No stridor.  GI: Soft. She exhibits no distension. There is no tenderness. There is no rebound and no guarding.       Gravid uterus, appropriate for gestational age.  Neurological: She is alert and oriented to person, place, and time. She has normal reflexes.  Skin: Skin is warm and dry. She is not diaphoretic.  Psychiatric: She has a normal mood and affect. Her behavior is normal. Judgment and thought content normal.    MAU Course  Procedures: None  During her stay in the MAU, her cervical exam changed over 2 hours as documented in the nursing notes.  Assessment and Plan  26 year old pregnant female in labor.  Discussed with Sharen Counter, who will evaluate the patient for admission for the labor floor.  Clancy Gourd 01/24/2012, 4:31 PM   I have seen this patient and agree with the  above resident's note.  LEFTWICH-KIRBY, Zen Cedillos Certified Nurse-[name]

## 2012-01-24 NOTE — Progress Notes (Signed)
FBS-[id number] 2 hr pp 82-173 most in range--some dietary adjustment. Reports generalized itching. BP mildly up-will check labs and bile salts and Pr/Cr ratio Hopefully in early labor--

## 2012-01-24 NOTE — MAU Note (Signed)
Patient is in for labor eval. She states that she is having intense pelvic and back pain. Reports good fetal movement. Denies lof. No vaginal bleeding

## 2012-01-25 LAB — GLUCOSE, CAPILLARY: Glucose-Capillary: 71 mg/dL (ref 70–99)

## 2012-01-25 LAB — TYPE AND SCREEN
ABO/RH(D): A POS
Antibody Screen: NEGATIVE

## 2012-01-25 LAB — PROTEIN / CREATININE RATIO, URINE
Protein Creatinine Ratio: 0.06 (ref <0.15)
Total Protein, Urine: 10 mg/dL

## 2012-01-25 LAB — RPR: RPR Ser Ql: NONREACTIVE

## 2012-01-25 LAB — BILE ACIDS, TOTAL: Bile Acids Total: 50 umol/L — ABNORMAL HIGH (ref 0–19)

## 2012-01-25 NOTE — H&P (Signed)
Agree with above note.  [name] H. 01/25/2012 7:44 AM

## 2012-01-25 NOTE — Discharge Instructions (Signed)
Contracciones de Braxton Hicks (Braxton Hicks Contractions) Usted presenta un falso trabajo de parto. Durante todo el embarazo aparecen con frecuencia contracciones del tero. Hacia el final del embarazo (32-34 semanas) estas contracciones (Braxton Hicks) pueden hacerse ms fuertes. No se trata de un trabajo de parto verdadero porque no producen un agrandamiento (dilatacin) y afinamiento del cuello del tero. Algunas veces resulta difcil distinguirlas del trabajo de parto verdadero porque en algunos casos llegan a ser muy intensas y las personas tienen distinta tolerancia al dolor. No debe sentirse avergonzada si ingresa al hospital con un falso trabajo de parto. En ocasiones la nica forma de saber si est en un parto verdadero es observar los cambios en el cuello del tero. A veces, la nica forma de saber si realmente est en trabajo de parto es para el mdico observar los cambios en el tero. Como diferenciar el trabajo de parto falso del verdadero:  Trabajos de parto falso.   Las contracciones falsas generalmente duran menos y no son tan intensas como las verdaderas.   Generalmente se sienten en la zona inferior del abdomen y en la ingle.   Pueden aliviarse con una caminata o cambiar de posicin mientras se est acostada.   A medida que pasa el tiempo son ms cortas y dbiles.   Generalmente son irregulares.   No se hacen progresivamente ms intensas y cercanas entre s como las verdaderas.   Trabajo de parto verdadero.   Las contracciones verdaderas duran de 30 a 70 segundos, son ms regulares, generalmente se hacen ms intensas y aumentan en frecuencia.   No desaparecen al caminar.   La molestia generalmente se siente en la parte superior del tero y se extiende hacia la zona inferior del abdomen y hacia la cintura.   El profesional que la asiste podr examinarla para determinar si el trabajo de parto es verdadero. El examen mostrar si el cuello del tero se est dilatando y  afinando.  Si no hay problemas prenatales u otras complicaciones de la salud asociadas al embarazo, no habr inconvenientes si la envan a su casa y espera el comienzo del verdadero trabajo de parto. INSTRUCCIONES PARA EL CUIDADO DOMICILIARIO  Siga con los ejercicios y las indicaciones habituales.   Tome los medicamentos como se le indic.   Cumpla con las citas regularmente.   Coma y beba ligero si cree que dar a luz.   Si se siente incmoda por las contracciones:   Cambie de actividad, si est acostada o en reposo, camine y si est caminando, repose.   Sintense y repose en una baadera con agua caliente.   Beba entre 2 y 3 vasos de agua. La deshidratacin puede causar contracciones BH.   Respire lenta y profundamente varias veces por hora.  SOLICITE ATENCIN MDICA DE INMEDIATO SI:  Las contracciones se intensifican, se hacen ms regulares y cercanas entre s.   Tiene una prdida importante de lquido de la vagina   La temperatura oral se eleva sin motivo por encima de 102 F (38.9 C) o segn le indique el profesional que la asiste.   Elimina una mucosidad sanguinolenta.   Presenta hemorragia vaginal.   Presenta dolor abdominal constante.   Siente un dolor en la parte baja de la espalda que nunca haba sentido antes.   Siente que el beb empuja hacia abajo y le causa presin plvica.   El beb no se mueve tanto como antes.  Document Released: 05/23/2005 Document Revised: 08/02/2011 [employer] Patient Information 2012 [employer]. 

## 2012-01-25 NOTE — Progress Notes (Signed)
Courtany Florina Ou is a 26 y.o. G3P2002 at [redacted]w[redacted]d by LMP admitted for active labor  Subjective: Pt sleeping intermittently.   Objective: BP 109/71  Pulse 81  Temp(Src) 97.9 F (36.6 C) (Oral)  Resp 18  Ht 4' 10.5" (1.486 m)  Wt 65.137 kg (143 lb 9.6 oz)  BMI 29.50 kg/m2  LMP 05/07/2011      FHT:  FHR: 125 bpm, variability: moderate,  accelerations:  Present,  decelerations:  Absent UC:   regular, every 8 minutes SVE:   5/70/-3 by Sharen Counter, CNM   Labs: Lab Results  Component Value Date   WBC 9.9 01/24/2012   HGB 11.7* 01/24/2012   HCT 35.8* 01/24/2012   MCV 90.4 01/24/2012   PLT 234 01/24/2012    Assessment / Plan: Protracted latent phase  Labor: Slow cervical change Preeclampsia:  N/A Fetal Wellbeing:  Category I Pain Control:  Labor support without medications I/D:  n/a Anticipated MOD:  NSVD  LEFTWICH-KIRBY, Laurieann Friddle 01/25/2012, 3:15 AM

## 2012-01-26 ENCOUNTER — Telehealth: Payer: Self-pay | Admitting: Family Medicine

## 2012-01-26 ENCOUNTER — Encounter (HOSPITAL_COMMUNITY): Payer: Self-pay | Admitting: Family

## 2012-01-26 ENCOUNTER — Inpatient Hospital Stay (HOSPITAL_COMMUNITY)
Admission: AD | Admit: 2012-01-26 | Discharge: 2012-01-28 | DRG: 775 | Disposition: A | Discharge status: Home / Self Care | Payer: Medicaid Other | Source: Ambulatory Visit | Attending: Family Medicine | Admitting: Family Medicine

## 2012-01-26 DIAGNOSIS — O0990 Supervision of high risk pregnancy, unspecified, unspecified trimester: Secondary | ICD-10-CM

## 2012-01-26 DIAGNOSIS — K838 Other specified diseases of biliary tract: Secondary | ICD-10-CM | POA: Diagnosis present

## 2012-01-26 DIAGNOSIS — O99892 Other specified diseases and conditions complicating childbirth: Principal | ICD-10-CM | POA: Diagnosis present

## 2012-01-26 DIAGNOSIS — O24419 Gestational diabetes mellitus in pregnancy, unspecified control: Secondary | ICD-10-CM

## 2012-01-26 DIAGNOSIS — O26619 Liver and biliary tract disorders in pregnancy, unspecified trimester: Secondary | ICD-10-CM

## 2012-01-26 DIAGNOSIS — O99814 Abnormal glucose complicating childbirth: Secondary | ICD-10-CM | POA: Diagnosis present

## 2012-01-26 LAB — CBC
HCT: 35.1 % — ABNORMAL LOW (ref 36.0–46.0)
Hemoglobin: 11.6 g/dL — ABNORMAL LOW (ref 12.0–15.0)
MCH: 30 pg (ref 26.0–34.0)
MCV: 90.7 fL (ref 78.0–100.0)
RBC: 3.87 MIL/uL (ref 3.87–5.11)

## 2012-01-26 LAB — COMPREHENSIVE METABOLIC PANEL
AST: 23 U/L (ref 0–37)
CO2: 20 mEq/L (ref 19–32)
Calcium: 9.1 mg/dL (ref 8.4–10.5)
Creatinine, Ser: 0.56 mg/dL (ref 0.50–1.10)
GFR calc Af Amer: >90 mL/min (ref >90)
GFR calc non Af Amer: >90 mL/min (ref >90)
Glucose, Bld: 75 mg/dL (ref 70–99)

## 2012-01-26 LAB — PROTEIN / CREATININE RATIO, URINE
Protein Creatinine Ratio: 0.21 — ABNORMAL HIGH (ref 0.00–0.15)
Total Protein, Urine: 18.8 mg/dL

## 2012-01-26 MED ORDER — CITRIC ACID-SODIUM CITRATE 334-500 MG/5ML PO SOLN: 30.0000 mL | ORAL | Status: DC | PRN

## 2012-01-26 MED ORDER — ZOLPIDEM TARTRATE 10 MG PO TABS: 10.0000 mg | ORAL_TABLET | Freq: Every evening | ORAL | Status: DC | PRN

## 2012-01-26 MED ORDER — OXYTOCIN BOLUS FROM INFUSION
500.0000 mL | Freq: Once | INTRAVENOUS | Status: DC
  Filled 2012-01-26: qty 500

## 2012-01-26 MED ORDER — ACETAMINOPHEN 325 MG PO TABS: 650.0000 mg | ORAL_TABLET | ORAL | Status: DC | PRN

## 2012-01-26 MED ORDER — OXYCODONE-ACETAMINOPHEN 5-325 MG PO TABS: 1.0000 | ORAL_TABLET | ORAL | Status: DC | PRN

## 2012-01-26 MED ORDER — OXYTOCIN 20 UNITS IN LACTATED RINGERS INFUSION - SIMPLE
125.0000 mL/h | Freq: Once | INTRAVENOUS | Status: AC
  Administered 2012-01-27: 125 mL/h via INTRAVENOUS

## 2012-01-26 MED ORDER — LACTATED RINGERS IV SOLN
INTRAVENOUS | Status: DC
  Administered 2012-01-26 – 2012-01-27 (×3): via INTRAVENOUS

## 2012-01-26 MED ORDER — FLEET ENEMA 7-19 GM/118ML RE ENEM: 1.0000 | ENEMA | RECTAL | Status: DC | PRN

## 2012-01-26 MED ORDER — OXYTOCIN 20 UNITS IN LACTATED RINGERS INFUSION - SIMPLE
1.0000 m[IU]/min | INTRAVENOUS | Status: DC
  Administered 2012-01-26: 2 m[IU]/min via INTRAVENOUS
  Filled 2012-01-26: qty 1000

## 2012-01-26 MED ORDER — LIDOCAINE HCL (PF) 1 % IJ SOLN
30.0000 mL | INTRAMUSCULAR | Status: DC | PRN
  Filled 2012-01-26: qty 30

## 2012-01-26 MED ORDER — LACTATED RINGERS IV SOLN: 500.0000 mL | INTRAVENOUS | Status: DC | PRN

## 2012-01-26 MED ORDER — ONDANSETRON HCL 4 MG/2ML IJ SOLN: 4.0000 mg | Freq: Four times a day (QID) | INTRAMUSCULAR | Status: DC | PRN

## 2012-01-26 MED ORDER — IBUPROFEN 600 MG PO TABS: 600.0000 mg | ORAL_TABLET | Freq: Four times a day (QID) | ORAL | Status: DC | PRN

## 2012-01-26 NOTE — Telephone Encounter (Signed)
Bile salts are 50--needs to be delivered.

## 2012-01-26 NOTE — H&P (Signed)
Karen Gamble is a 26 y.o. female presenting for induction of labor for cholestasis of pregnancy.  Maternal Medical History:  Fetal activity: Perceived fetal activity is normal.   Last perceived fetal movement was within the past hour.    Prenatal complications: Substance abuse: prat, tany.    Complications - cholestasis of pregnancy and diet controlled gestational diabetes. OB History    Grav Para Term Preterm Abortions TAB SAB Ect Mult Living   3 2 2  0 0 0 0 0 0 2     Past Medical History  Diagnosis Date  . No pertinent past medical history   . Gestational diabetes     2nd pregnancy   Past Surgical History  Procedure Date  . Cholecystectomy    Family History: family history is not on file. Social History:  reports that she has never smoked. She does not have any smokeless tobacco history on file. She reports that she does not drink alcohol or use illicit drugs.  Review of Systems  Gastrointestinal: Positive for abdominal pain (cramping).  Skin: Positive for itching and rash.  All other systems reviewed and are negative.    Dilation: 3.5 Effacement (%): 60 Station: -2 Exam by:: Pratt Blood pressure 137/98, pulse 93, temperature 98.1 F (36.7 C), temperature source Oral, resp. rate 18, last menstrual period 05/07/2011. Maternal Exam:  Abdomen: Estimated fetal weight is 6.5-7lbs.   Fetal presentation: vertex     Physical Exam  Constitutional: She is oriented to person, place, and time. She appears well-developed and well-nourished. No distress.  HENT:  Head: Normocephalic.  Neck: Normal range of motion. Neck supple.  Cardiovascular: Normal rate, regular rhythm and normal heart sounds.   Respiratory: Effort normal and breath sounds normal. No respiratory distress.  GI: Soft. There is no tenderness.  Genitourinary: No bleeding around the vagina.  Neurological: She is alert and oriented to person, place, and time.  Skin: Skin is warm and dry.      Prenatal labs: ABO, Rh: --/--/A POS (05/30 2315) Antibody: NEG (05/30 2315) Rubella: 173.5 (11/21 1135) RPR: NON REACTIVE (05/30 2045)  HBsAg: NEGATIVE (11/21 1135)  HIV: NON REACTIVE (11/21 1135)  GBS: Negative (05/20 0000)   Assessment/Plan: Cholestasis of Pregnancy Diet Controlled Gestational Diabetes Elevated Blood Pressure  Plan: Admit to Birthing Suites CMP/CBC PR/CR ratio Pitocin augmentation Anticipate NSVD  [facility] 01/26/2012, 7:45 PM

## 2012-01-27 ENCOUNTER — Inpatient Hospital Stay (HOSPITAL_COMMUNITY): Payer: Medicaid Other | Admitting: Anesthesiology

## 2012-01-27 ENCOUNTER — Encounter (HOSPITAL_COMMUNITY): Payer: Self-pay | Admitting: *Deleted

## 2012-01-27 ENCOUNTER — Encounter (HOSPITAL_COMMUNITY): Payer: Self-pay | Admitting: Anesthesiology

## 2012-01-27 DIAGNOSIS — K838 Other specified diseases of biliary tract: Secondary | ICD-10-CM

## 2012-01-27 DIAGNOSIS — O99814 Abnormal glucose complicating childbirth: Secondary | ICD-10-CM

## 2012-01-27 DIAGNOSIS — O9989 Other specified diseases and conditions complicating pregnancy, childbirth and the puerperium: Secondary | ICD-10-CM

## 2012-01-27 MED ORDER — SENNOSIDES-DOCUSATE SODIUM 8.6-50 MG PO TABS: 2.0000 | ORAL_TABLET | Freq: Every day | ORAL | Status: DC

## 2012-01-27 MED ORDER — IBUPROFEN 600 MG PO TABS
600.0000 mg | ORAL_TABLET | Freq: Four times a day (QID) | ORAL | Status: DC
  Administered 2012-01-27 – 2012-01-28 (×4): 600 mg via ORAL
  Filled 2012-01-27 (×4): qty 1

## 2012-01-27 MED ORDER — LORATADINE 10 MG PO TABS
10.0000 mg | ORAL_TABLET | Freq: Every day | ORAL | Status: DC
  Filled 2012-01-27 (×3): qty 1

## 2012-01-27 MED ORDER — DIPHENHYDRAMINE HCL 50 MG/ML IJ SOLN: 12.5000 mg | INTRAMUSCULAR | Status: DC | PRN

## 2012-01-27 MED ORDER — OXYCODONE-ACETAMINOPHEN 5-325 MG PO TABS: 1.0000 | ORAL_TABLET | ORAL | Status: DC | PRN

## 2012-01-27 MED ORDER — LACTATED RINGERS IV SOLN
500.0000 mL | Freq: Once | INTRAVENOUS | Status: AC
  Administered 2012-01-27: 500 mL via INTRAVENOUS

## 2012-01-27 MED ORDER — PHENYLEPHRINE 40 MCG/ML (10ML) SYRINGE FOR IV PUSH (FOR BLOOD PRESSURE SUPPORT): 80.0000 ug | PREFILLED_SYRINGE | INTRAVENOUS | Status: DC | PRN

## 2012-01-27 MED ORDER — WITCH HAZEL-GLYCERIN EX PADS: 1.0000 "application " | MEDICATED_PAD | CUTANEOUS | Status: DC | PRN

## 2012-01-27 MED ORDER — PHENYLEPHRINE 40 MCG/ML (10ML) SYRINGE FOR IV PUSH (FOR BLOOD PRESSURE SUPPORT)
80.0000 ug | PREFILLED_SYRINGE | INTRAVENOUS | Status: DC | PRN
  Filled 2012-01-27: qty 5

## 2012-01-27 MED ORDER — CYCLOBENZAPRINE HCL 10 MG PO TABS
10.0000 mg | ORAL_TABLET | Freq: Three times a day (TID) | ORAL | Status: DC | PRN
  Filled 2012-01-27: qty 1

## 2012-01-27 MED ORDER — BUTORPHANOL TARTRATE 2 MG/ML IJ SOLN
1.0000 mg | INTRAMUSCULAR | Status: DC | PRN
  Administered 2012-01-27: 1 mg via INTRAVENOUS
  Filled 2012-01-27: qty 1

## 2012-01-27 MED ORDER — ONDANSETRON HCL 4 MG PO TABS: 4.0000 mg | ORAL_TABLET | ORAL | Status: DC | PRN

## 2012-01-27 MED ORDER — LIDOCAINE HCL (PF) 1 % IJ SOLN
INTRAMUSCULAR | Status: DC | PRN
  Administered 2012-01-27: 30 mL
  Administered 2012-01-27 (×2): 5 mL

## 2012-01-27 MED ORDER — FENTANYL 2.5 MCG/ML BUPIVACAINE 1/10 % EPIDURAL INFUSION (WH - ANES)
14.0000 mL/h | INTRAMUSCULAR | Status: DC
  Administered 2012-01-27 (×2): 14 mL/h via EPIDURAL
  Filled 2012-01-27 (×2): qty 60

## 2012-01-27 MED ORDER — ONDANSETRON HCL 4 MG/2ML IJ SOLN: 4.0000 mg | INTRAMUSCULAR | Status: DC | PRN

## 2012-01-27 MED ORDER — PRENATAL MULTIVITAMIN CH
1.0000 | ORAL_TABLET | Freq: Every day | ORAL | Status: DC
  Administered 2012-01-28: 1 via ORAL

## 2012-01-27 MED ORDER — EPHEDRINE 5 MG/ML INJ
10.0000 mg | INTRAVENOUS | Status: DC | PRN
  Filled 2012-01-27: qty 4

## 2012-01-27 MED ORDER — ZOLPIDEM TARTRATE 5 MG PO TABS: 5.0000 mg | ORAL_TABLET | Freq: Every evening | ORAL | Status: DC | PRN

## 2012-01-27 MED ORDER — DIBUCAINE 1 % RE OINT: 1.0000 "application " | TOPICAL_OINTMENT | RECTAL | Status: DC | PRN

## 2012-01-27 MED ORDER — SIMETHICONE 80 MG PO CHEW: 80.0000 mg | CHEWABLE_TABLET | ORAL | Status: DC | PRN

## 2012-01-27 MED ORDER — EPHEDRINE 5 MG/ML INJ: 10.0000 mg | INTRAVENOUS | Status: DC | PRN

## 2012-01-27 MED ORDER — BENZOCAINE-MENTHOL 20-0.5 % EX AERO: 1.0000 "application " | INHALATION_SPRAY | CUTANEOUS | Status: DC | PRN

## 2012-01-27 MED ORDER — LANOLIN HYDROUS EX OINT: TOPICAL_OINTMENT | CUTANEOUS | Status: DC | PRN

## 2012-01-27 MED ORDER — DIPHENHYDRAMINE HCL 25 MG PO CAPS: 25.0000 mg | ORAL_CAPSULE | Freq: Four times a day (QID) | ORAL | Status: DC | PRN

## 2012-01-27 MED ORDER — TETANUS-DIPHTH-ACELL PERTUSSIS 5-2.5-18.5 LF-MCG/0.5 IM SUSP
0.5000 mL | Freq: Once | INTRAMUSCULAR | Status: AC
  Administered 2012-01-28: 0.5 mL via INTRAMUSCULAR

## 2012-01-27 NOTE — Progress Notes (Signed)
  Subjective:   Objective: BP 114/72  Pulse 100  Temp(Src) 98.1 F (36.7 C) (Oral)  Resp 18  Ht 4' 10.5" (1.486 m)  Wt 65.273 kg (143 lb 14.4 oz)  BMI 29.56 kg/m2  SpO2 98%  LMP 05/07/2011   Total I/O In: -  Out: 50 [Urine:50]  FHT:  FHR: 140s bpm, variability: moderate,  accelerations:  Present,  decelerations:  Absent UC:   regular, every 4-5 minutes SVE:   Dilation: 5 Effacement (%): 50 Station: -2 Exam by:: k fields, rn  Labs: Lab Results  Component Value Date   WBC 8.1 01/26/2012   HGB 11.6* 01/26/2012   HCT 35.1* 01/26/2012   MCV 90.7 01/26/2012   PLT 251 01/26/2012    Assessment / Plan: AROM with clear fluid.  Category 1 tracing.  Continue pitocin.  Expectant managment.  Karen Gamble 01/27/2012, 9:22 AM

## 2012-01-27 NOTE — Anesthesia Preprocedure Evaluation (Signed)

## 2012-01-27 NOTE — Progress Notes (Signed)
   Subjective: Pt reports increased pain and requests epidural.   Objective: BP 130/85  Pulse 73  Temp(Src) 98.1 F (36.7 C) (Oral)  Resp 18  Ht 4' 10.5" (1.486 m)  Wt 65.273 kg (143 lb 14.4 oz)  BMI 29.56 kg/m2  SpO2 98%  LMP 05/07/2011      FHT:  FHR: 120's bpm, variability: moderate,  accelerations:  Present,  decelerations:  Absent UC:   irregular, every 1-6 minutes SVE:   Dilation: 5.5 Effacement (%): 70;80 Station: -2 Exam by:: Lilli Few, RN  Labs: Lab Results  Component Value Date   WBC 8.1 01/26/2012   HGB 11.6* 01/26/2012   HCT 35.1* 01/26/2012   MCV 90.7 01/26/2012   PLT 251 01/26/2012    Assessment / Plan: Irregular Contractions  Labor: Irregular Contractions Preeclampsia:  labs stable Fetal Wellbeing:  Category I Pain Control:  Epidural I/D:  n/a Anticipated MOD:  NSVD  Once comfortable consider AROM  [facility] 01/27/2012, 5:37 AM

## 2012-01-27 NOTE — Anesthesia Procedure Notes (Signed)
Epidural Patient location during procedure: OB Start time: 01/27/2012 5:16 AM  Staffing Anesthesiologist: Brayton Caves R Performed by: anesthesiologist   Preanesthetic Checklist Completed: patient identified, site marked, surgical consent, pre-op evaluation, timeout performed, IV checked, risks and benefits discussed and monitors and equipment checked  Epidural Patient position: sitting Prep: site prepped and draped and DuraPrep Patient monitoring: continuous pulse ox and blood pressure Approach: midline Injection technique: LOR air and LOR saline  Needle:  Needle type: Tuohy  Needle gauge: 17 G Needle length: 9 cm Needle insertion depth: 5 cm cm Catheter type: closed end flexible Catheter size: 19 Gauge Catheter at skin depth: 10 cm Test dose: negative  Assessment Events: blood not aspirated, injection not painful, no injection resistance, negative IV test and no paresthesia  Additional Notes Patient identified.  Risk benefits discussed including failed block, incomplete pain control, headache, nerve damage, paralysis, blood pressure changes, nausea, vomiting, reactions to medication both toxic or allergic, and postpartum back pain.  Patient expressed understanding and wished to proceed.  All questions were answered.  Sterile technique used throughout procedure and epidural site dressed with sterile barrier dressing. No paresthesia or other complications noted.The patient did not experience any signs of intravascular injection such as tinnitus or metallic taste in mouth nor signs of intrathecal spread such as rapid motor block. Please see nursing notes for vital signs.

## 2012-01-27 NOTE — Anesthesia Postprocedure Evaluation (Signed)
  Anesthesia Post-op Note  Patient: Karen Gamble  Procedure(s) Performed: * No procedures listed *  Patient Location: PACU and Mother/Baby  Anesthesia Type: Epidural  Level of Consciousness: awake, alert  and oriented  Airway and Oxygen Therapy: Patient Spontanous Breathing    Post-op Assessment: Patient's Cardiovascular Status Stable and Respiratory Function Stable  Post-op Vital Signs: stable  Complications: No apparent anesthesia complications

## 2012-01-27 NOTE — Progress Notes (Signed)
   Subjective: Pt is reporting increased pain with contractions.    Objective: BP 122/88  Pulse 85  Temp(Src) 97.9 F (36.6 C) (Oral)  Resp 16  Ht 4' 10.5" (1.486 m)  Wt 65.273 kg (143 lb 14.4 oz)  BMI 29.56 kg/m2  LMP 05/07/2011      FHT:  FHR: 120's bpm, variability: moderate,  accelerations:  Present,  decelerations:  Absent UC:   regular, every 1.5-4 minutes SVE:   Dilation: 4 Effacement (%): 60 Station: -2 Exam by:: Lilli Few, RN  Labs: Lab Results  Component Value Date   WBC 8.1 01/26/2012   HGB 11.6* 01/26/2012   HCT 35.1* 01/26/2012   MCV 90.7 01/26/2012   PLT 251 01/26/2012    Assessment / Plan: Augmentation of labor, progressing well  Labor: Progressing on Pitocin, will continue to increase then AROM Preeclampsia:  labs stable Fetal Wellbeing:  Category I Pain Control:  Labor support without medications I/D:  n/a Anticipated MOD:  NSVD  [facility] 01/27/2012, 12:24 AM

## 2012-01-28 LAB — CBC
HCT: 27.8 % — ABNORMAL LOW (ref 36.0–46.0)
MCHC: 32.7 g/dL (ref 30.0–36.0)
MCV: 90.8 fL (ref 78.0–100.0)
RDW: 13.9 % (ref 11.5–15.5)

## 2012-01-28 MED ORDER — IBUPROFEN 600 MG PO TABS: 600.0000 mg | ORAL_TABLET | Freq: Four times a day (QID) | ORAL | Status: AC

## 2012-01-28 MED ORDER — LANOLIN HYDROUS EX OINT: 1.0000 "application " | TOPICAL_OINTMENT | CUTANEOUS | Status: DC | PRN

## 2012-01-28 NOTE — Progress Notes (Signed)
[facility] Department of Clinical Social Work Documentation of Interpretation   I assisted _Dr Anyanwu__________________ with interpretation of _plan of care_____________________ for this patient. 

## 2012-01-28 NOTE — Discharge Summary (Signed)
Attestation of Attending Supervision of Advanced Practitioner: Evaluation and management procedures were performed by the [facility] Fellow/PA/CNM/NP under my supervision and collaboration. Chart reviewed, and agree with management and plan.  Jaynie Collins, M.D. 01/28/2012 8:12 PM

## 2012-01-28 NOTE — Discharge Summary (Signed)
Obstetric Discharge Summary Reason for Admission: induction of labor Prenatal Procedures: none Intrapartum Procedures: spontaneous vaginal delivery Postpartum Procedures: none Complications-Operative and Postpartum: none Hemoglobin  Date Value Range Status  01/28/2012 9.1* 12.0-15.0 (g/dL) Final     DELTA CHECK NOTED     REPEATED TO VERIFY     HCT  Date Value Range Status  01/28/2012 27.8* 36.0-46.0 (%) Final    Physical Exam:  General: alert, cooperative, appears stated age and no distress Lochia: appropriate Uterine Fundus: firm DVT Evaluation: No evidence of DVT seen on physical exam.  Discharge Diagnoses: Term Pregnancy-delivered  Discharge Information: Date: 01/28/2012 Activity: pelvic rest Diet: routine Medications: Ibuprofen Condition: stable Instructions: refer to practice specific booklet Discharge to: home   Newborn Data: Live born female  Birth Weight: 6 lb 7.9 oz (2945 g) APGAR: 9, 9  Home with mother.  Delivery Note At 11:29 AM on 01/27/2012 a viable female was delivered via Vaginal, Spontaneous Delivery (Presentation: Right Occiput Anterior).  APGAR: 9, 9; weight .   Placenta status: Intact, Spontaneous.  Cord: 3 vessels with the following complications: None.   Anesthesia: Epidural  Episiotomy: None Lacerations: 2nd degree Suture Repair: 3.0 vicryl Est. Blood Loss (mL): 200  Mom to postpartum.  Baby to nursery-stable.  STINSON, JACOB JEHIEL 01/27/2012, 11:49 AM     CRANSTOUN, LANDI 01/28/2012, 3:47 PM

## 2012-01-28 NOTE — Progress Notes (Cosign Needed)
Post Partum Day 1 Subjective: no complaints, voiding, tolerating PO and +BM No foul smelling lochia reported. Pain well controlled  Objective: Blood pressure 119/79, pulse 65, temperature 98.2 F (36.8 C), temperature source Oral, resp. rate 20, height 4' 10.5" (1.486 m), weight 65.273 kg (143 lb 14.4 oz), last menstrual period 05/07/2011, SpO2 98.00%, unknown if currently breastfeeding.  Physical Exam:  General: alert, cooperative, appears stated age and no distress Lochia: appropriate Uterine Fundus: firm DVT Evaluation: No evidence of DVT seen on physical exam.   Basename 01/28/12 0546 01/26/12 1945  HGB 9.1* 11.6*  HCT 27.8* 35.1*    Assessment/Plan: Plan for discharge tomorrow   LOS: 2 days   Karen Gamble 01/28/2012, 7:44 AM

## 2012-01-28 NOTE — Progress Notes (Signed)
Post Partum Day 1 Subjective: No complaints, up ad lib, voiding, tolerating PO and + flatus.  Breastfeeding.  Baby girl doing well, no concerns.  Encounter done with the help of a Spanish interpreter.  Objective: Blood pressure 119/79, pulse 65, temperature 98.2 F (36.8 C), temperature source Oral, resp. rate 20, height 4' 10.5" (1.486 m), weight 143 lb 14.4 oz (65.273 kg), last menstrual period 05/07/2011, SpO2 98.00%, unknown if currently breastfeeding.  Physical Exam:  General: alert and no distress Lochia: appropriate Uterine Fundus: firm DVT Evaluation: No evidence of DVT seen on physical exam. Negative Homan's sign.   Basename 01/28/12 0546 01/26/12 1945  HGB 9.1* 11.6*  HCT 27.8* 35.1*    Assessment/Plan: Contraception undecided May discharge to home today if she desires; RN to call if the patient desires early discharge.   LOS: 2 days   Dandre Sisler A 01/28/2012, 9:34 AM

## 2012-01-28 NOTE — Progress Notes (Signed)
UR chart review completed.  

## 2012-01-31 ENCOUNTER — Encounter: Payer: Self-pay | Admitting: Obstetrics & Gynecology

## 2012-02-02 ENCOUNTER — Inpatient Hospital Stay (HOSPITAL_COMMUNITY): Payer: Self-pay

## 2012-02-02 ENCOUNTER — Inpatient Hospital Stay (HOSPITAL_COMMUNITY)
Admission: AD | Admit: 2012-02-02 | Discharge: 2012-02-02 | Disposition: A | Discharge status: Home / Self Care | Payer: Medicaid Other | Source: Ambulatory Visit | Attending: Obstetrics & Gynecology | Admitting: Obstetrics & Gynecology

## 2012-02-02 ENCOUNTER — Encounter (HOSPITAL_COMMUNITY): Payer: Self-pay | Admitting: Obstetrics and Gynecology

## 2012-02-02 DIAGNOSIS — R1032 Left lower quadrant pain: Secondary | ICD-10-CM | POA: Insufficient documentation

## 2012-02-02 DIAGNOSIS — O0990 Supervision of high risk pregnancy, unspecified, unspecified trimester: Secondary | ICD-10-CM

## 2012-02-02 DIAGNOSIS — O99891 Other specified diseases and conditions complicating pregnancy: Secondary | ICD-10-CM | POA: Insufficient documentation

## 2012-02-02 DIAGNOSIS — N719 Inflammatory disease of uterus, unspecified: Secondary | ICD-10-CM

## 2012-02-02 DIAGNOSIS — O99810 Abnormal glucose complicating pregnancy: Secondary | ICD-10-CM

## 2012-02-02 DIAGNOSIS — O24419 Gestational diabetes mellitus in pregnancy, unspecified control: Secondary | ICD-10-CM

## 2012-02-02 LAB — CBC
HCT: 37.6 % (ref 36.0–46.0)
Hemoglobin: 12.3 g/dL (ref 12.0–15.0)
MCH: 29.7 pg (ref 26.0–34.0)
MCHC: 32.7 g/dL (ref 30.0–36.0)
MCV: 90.8 fL (ref 78.0–100.0)
Platelets: 339 10*3/uL (ref 150–400)
RBC: 4.14 MIL/uL (ref 3.87–5.11)
RDW: 13.8 % (ref 11.5–15.5)
WBC: 10.6 10*3/uL — ABNORMAL HIGH (ref 4.0–10.5)

## 2012-02-02 LAB — URINE MICROSCOPIC-ADD ON

## 2012-02-02 LAB — URINALYSIS, ROUTINE W REFLEX MICROSCOPIC
Bilirubin Urine: NEGATIVE
Protein, ur: NEGATIVE mg/dL
Urobilinogen, UA: 0.2 mg/dL (ref 0.0–1.0)

## 2012-02-02 MED ORDER — CEFTRIAXONE SODIUM 250 MG IJ SOLR: 250.0000 mg | Freq: Once | INTRAMUSCULAR | Status: DC

## 2012-02-02 MED ORDER — OXYCODONE-ACETAMINOPHEN 5-325 MG PO TABS: 1.0000 | ORAL_TABLET | ORAL | Status: AC | PRN

## 2012-02-02 MED ORDER — METRONIDAZOLE 500 MG PO TABS: 500.0000 mg | ORAL_TABLET | Freq: Two times a day (BID) | ORAL | Status: AC

## 2012-02-02 MED ORDER — OXYCODONE-ACETAMINOPHEN 5-325 MG PO TABS: 2.0000 | ORAL_TABLET | Freq: Once | ORAL | Status: DC

## 2012-02-02 NOTE — MAU Note (Signed)
Had vaginal birth on 6/2 having sharp intermittent left lower abdominal pain (3 times) no painful urination, regular BM, vaginal bleeding has decreased

## 2012-02-02 NOTE — Discharge Instructions (Signed)
Endometritis (Endometritis)  La endometritis es la irritacin , dolor e hinchazn (inflamacin) en la membrana que tapiza el tero (endometrio).  CAUSAS   Infecciones bacterianas.   Enfermedades de transmisin sexual (ETS).   Haber sufrido un aborto o despus de un parto, especialmente despus de un trabajo de parto prolongado o una cesrea.   Ciertos procedimientos ginecolgicos (dilatacin y curetaje, histeroscopa o la insercin de un dispositivo anticonceptivo).  SNTOMAS   Grant Ruts.   Dolor en la zona baja del abdomen o dolor plvico.   Secrecin o sangrado vaginal anormal.   Hinchazn abdominal o meteorismo (distensin).   Malestar general, sentirse enferma.   Molestias al mover el intestino.  DIAGNSTICO Le indicarn un examen fsico y de la pelvis. Otras pruebas son:   Cultivos de material obtenido en el cuello del tero.   Anlisis de [place].   Examen de [place] de tejido de las membranas que tapizan el tero (biopsia de endometrio).   Examen de las secreciones en el microscopio (preparado fresco).   Laparoscopa.  TRATAMIENTO Generalmente se indican antibiticos. Otros tratamientos pueden ser:   Administracin de lquidos por la vena (lquidos por va intravenosa).   Hacer reposo.  INSTRUCCIONES PARA EL CUIDADO DOMICILIARIO  Utilice los medicamentos de venta libre o de prescripcin para Chief Technology Officer, el malestar o la [name], segn se lo indique el profesional que lo asiste.   Puede reanudar su dieta y sus actividades normales segn se le haya indicado o segn su tolerancia.   No se haga duchas vaginales ni mantenga relaciones sexuales hasta que el cirujano se lo permita.   Tome los antibiticos como se le indic. Tmelos todos, aunque se sienta mejor.   No tenga relaciones sexuales hasta que su compaero haya sido tratado si la causa de la cervicitis es una enfermedad de transmisin sexual.  SOLICITE ATENCIN MDICA DE INMEDIATO SI:  Presenta hinchazn  o aumento del dolor en el abdomen.   Tiene fiebre.   Tiene una secrecin vaginal con mal olor, o ha aumentado la cantidad de [name].   [name] hemorragia vaginal anormal.   El medicamento no le Scientist, clinical (histocompatibility and immunogenetics).   Tiene algn problema que puede relacionarse con el medicamento que est tomando.   Comienza a sentir nuseas y vmitos o no puede [employer].   Siente dolor al mover el intestino.  EST SEGURO QUE:   Comprende las instrucciones para el alta mdica.   Controlar su enfermedad.   Solicitar atencin mdica de inmediato segn las indicaciones.  Document Released: 05/23/2005 Document Revised: 08/02/2011 [name] Patient Information 2012 [name], [name].

## 2012-02-02 NOTE — MAU Provider Note (Signed)
Attestation of Attending Supervision of Advanced Practitioner: Evaluation and management procedures were performed by the [facility] Fellow/PA/CNM/NP under my supervision and collaboration. Chart reviewed, and agree with management and plan.  Jaynie Collins, M.D. 02/02/2012 5:40 PM

## 2012-02-02 NOTE — MAU Provider Note (Signed)
History     CSN: [id number]  Arrival date and time: 02/02/12 1014   First Provider Initiated Contact with Patient 02/02/12 1510      Chief Complaint  Patient presents with  . Abdominal Pain   HPI Pt presents for eval of left low abd/side pain today. Denies constipation, diarrhea, fever. She is 6d s/p uncomplicated vag del. Reports nl lochia without odor. OB History    Grav Para Term Preterm Abortions TAB SAB Ect Mult Living   3 3 3  0 0 0 0 0 0 3      Past Medical History  Diagnosis Date  . No pertinent past medical history   . Gestational diabetes     2nd pregnancy    Past Surgical History  Procedure Date  . Cholecystectomy     History reviewed. No pertinent family history.  History  Substance Use Topics  . Smoking status: Never Smoker   . Smokeless tobacco: Never Used  . Alcohol Use: No    Allergies: No Known Allergies  Prescriptions prior to admission  Medication Sig Dispense Refill  . ibuprofen (ADVIL,MOTRIN) 600 MG tablet Take 1 tablet (600 mg total) by mouth every 6 (six) hours.  90 tablet  0  . lanolin OINT Apply 1 application topically as needed (for breast care).  7 g  1  . Prenatal Vit-Fe Fumarate-FA (PRENATAL MULTIVITAMIN) TABS Take 1 tablet by mouth every morning.         ROS Physical Exam   Blood pressure 128/90, pulse 74, temperature 98.1 F (36.7 C), resp. rate 18, last menstrual period 05/07/2011, unknown if currently breastfeeding.  Physical Exam  Constitutional: She is oriented to person, place, and time. She appears well-developed and well-nourished.  HENT:  Head: Normocephalic.  Cardiovascular: Normal rate.   Respiratory: Effort normal.  GI:       Tender on palp of LLQ and sl tender on left side and at fundus  Musculoskeletal: Normal range of motion.  Neurological: She is alert and oriented to person, place, and time.  Skin: Skin is warm and dry.  Psychiatric: She has a normal mood and affect. Her behavior is normal.    Urinalysis    Component Value Date/Time   COLORURINE YELLOW 02/02/2012 1030   APPEARANCEUR CLEAR 02/02/2012 1030   LABSPEC 1.010 02/02/2012 1030   PHURINE 6.0 02/02/2012 1030   GLUCOSEU NEGATIVE 02/02/2012 1030   HGBUR LARGE* 02/02/2012 1030   BILIRUBINUR NEGATIVE 02/02/2012 1030   KETONESUR NEGATIVE 02/02/2012 1030   PROTEINUR NEGATIVE 02/02/2012 1030   UROBILINOGEN 0.2 02/02/2012 1030   NITRITE NEGATIVE 02/02/2012 1030   LEUKOCYTESUR MODERATE* 02/02/2012 1030     CBC    Component Value Date/Time   WBC 10.6* 02/02/2012 1146   RBC 4.14 02/02/2012 1146   HGB 12.3 02/02/2012 1146   HCT 37.6 02/02/2012 1146   PLT 339 02/02/2012 1146   MCV 90.8 02/02/2012 1146   MCH 29.7 02/02/2012 1146   MCHC 32.7 02/02/2012 1146   RDW 13.8 02/02/2012 1146   LYMPHSABS 1.9 07/18/2011 1135   MONOABS 0.3 07/18/2011 1135   EOSABS 0.3 07/18/2011 1135   BASOSABS 0.0 07/18/2011 1135   TRANSABDOMINAL ULTRASOUND OF PELVIS  Technique: Transabdominal ultrasound examination of the pelvis was  performed including evaluation of the uterus, ovaries, adnexal  regions, and pelvic cul-de-sac.  Comparison: Ultrasound of the 12/21/2011  Findings:  Uterus: Uterus is normal in shape. No focal uterine mass is  identified.  Endometrium: There is some fluid within the  endometrial canal of  the uterine body, and to a greater extent in the lower uterine  segment. There is some heterogeneous material within the fluid  which could reflect blood products.  At the uterine fundus, the endometrial thickness measures up to 16  mm. Focal curvilinear blood vessel with flow is seen coursing to  the thickened portion of the uterine fundus, to the left of  midline. The sonographer reports that the patient is tender when  direct pressure is applied to the left uterine fundus region.  Right ovary: Normal appearance/no adnexal mass. Measures 2.7 x 1.5  x 1.7 cm.  Left ovary: Measures 3.3 x 2.0 x 2.3 cm. Contains a 0.7 x 0.7 x  0.8 mm echogenic nodule, likely a  small dermoid.  Other Findings: No free fluid  IMPRESSION:  1. Findings suspicious for retained products of conception,  particularly at the uterine fundus, to the left of midline. The  endometrial thickness measures up to 16 mm in this location, and  there is a focal area of color Doppler flow in this region. The  sonographer reports that the patient is tender with direct pressure  by ultrasound probe in this region.  2. Fluid and heterogeneous material in the mid to lower  endometrial canal likely reflect blood products.  3. 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  Original Report Authenticated By: Britta Mccreedy, M.D.    MAU Course  Procedures    Assessment and Plan  S/p vag del x 6d LLQ abd pain Possible endometriris  Dr Macon Large in to eval Given Rocephin 250mg  IM in MAU D/C home with rx Flagyl 500mg  BID x 7d F/U at [facility] visit unless begins with fever/inc pain    Cyrus Ramsburg 02/02/2012, 3:13 PM

## 2012-02-02 NOTE — MAU Note (Signed)
"  I had my baby on 01/27/12 and was d/c'd home on the 3rd.  Now, I'm having a very strong pain from the middle of my lower stomach to the Lt side that started about 2 days ago.  The pain comes and goes.  Not associated with my bleeding."

## 2012-02-04 NOTE — MAU Provider Note (Signed)
I have seen/examined this patient and agree with the assessment and plan. 

## 2012-02-07 NOTE — MAU Provider Note (Signed)
Medical Screening exam and patient care preformed by advanced practice provider.  Agree with the above management.  

## 2012-06-12 ENCOUNTER — Ambulatory Visit: Payer: Self-pay | Admitting: Physician Assistant

## 2012-06-12 ENCOUNTER — Encounter: Payer: Self-pay | Admitting: Physician Assistant

## 2012-06-12 VITALS — BP 110/78 | HR 84 | Temp 97.9°F | Resp 16 | Ht 60.0 in | Wt 129.8 lb

## 2012-06-12 DIAGNOSIS — M545 Low back pain: Secondary | ICD-10-CM

## 2012-06-12 LAB — POCT URINALYSIS DIPSTICK
Bilirubin, UA: NEGATIVE
Glucose, UA: NEGATIVE
Nitrite, UA: NEGATIVE
Spec Grav, UA: 1.025
pH, UA: 5.5

## 2012-06-12 LAB — POCT UA - MICROSCOPIC ONLY
Casts, Ur, LPF, POC: NEGATIVE
Crystals, Ur, HPF, POC: NEGATIVE

## 2012-06-12 MED ORDER — IBUPROFEN 800 MG PO TABS: 800.0000 mg | ORAL_TABLET | Freq: Three times a day (TID) | ORAL | Status: DC | PRN

## 2012-06-12 NOTE — Progress Notes (Signed)
Subjective:    Patient ID: Karen Gamble, female    DOB: 05/14/1986, 26 y.o.   MRN: [id number]  HPI  Ms. Karen Gamble is a 26 yr old female complaining of two weeks of low back pain.  She speaks very little [place].  In 2008, in [place] she had surgery and at that time also had an infection in her kidney.  This pain feels similar and she wonders if she could have a kidney infection again.  She denies urinary symptoms.  No fever or chills.  The pain is located across her lower back.  She has no pain or tingling in her legs.  Her bowel movements have been normal.  Pain is worse sitting or lying.  She does not work.  She denies any mechanism of injury.  She is currently breastfeeding.  She is currently on her period    Review of Systems  Constitutional: Negative for fever and chills.  Gastrointestinal: Negative.   Genitourinary: Positive for flank pain. Negative for frequency, hematuria and difficulty urinating.  Musculoskeletal: Positive for back pain and arthralgias (knees and elbows).  Neurological: Negative.        Objective:   Physical Exam  Constitutional: She is oriented to person, place, and time. She appears well-developed and well-nourished. No distress.  Cardiovascular: Normal rate, regular rhythm and normal heart sounds.   Pulmonary/Chest: Breath sounds normal. She has no wheezes. She has no rales.  Abdominal: There is no CVA tenderness.  Musculoskeletal:       Lumbar back: She exhibits tenderness and pain. She exhibits normal range of motion and no bony tenderness.  Neurological: She is alert and oriented to person, place, and time. She has normal strength. No sensory deficit.  Reflex Scores:      Bicep reflexes are 2+ on the right side and 2+ on the left side.      Patellar reflexes are 2+ on the right side and 2+ on the left side.      Achilles reflexes are 2+ on the right side and 2+ on the left side.  Filed Vitals:   06/12/12 0905  BP: 110/78  Pulse: 84  Temp: 97.9  F (36.6 C)  Resp: 16    Results for orders placed in visit on 06/12/12  POCT URINALYSIS DIPSTICK      Component Value Range   Color, UA yellow     Clarity, UA cloudy     Glucose, UA neg     Bilirubin, UA neg     Ketones, UA neg     Spec Grav, UA 1.025     Blood, UA moderate     pH, UA 5.5     Protein, UA neg     Urobilinogen, UA 0.2     Nitrite, UA neg     Leukocytes, UA Negative    POCT UA - MICROSCOPIC ONLY      Component Value Range   WBC, Ur, HPF, POC 0-1     RBC, urine, microscopic 3-5     Bacteria, U Microscopic 1+     Mucus, UA neg     Epithelial cells, urine per micros 3-8     Crystals, Ur, HPF, POC neg     Casts, Ur, LPF, POC neg     Yeast, UA neg            Assessment & Plan:   1. Low back pain  POCT urinalysis dipstick, POCT UA - Microscopic Only, Urine culture, ibuprofen (ADVIL,MOTRIN) 800  MG tablet   Ms. Karen Gamble is a 26 yr old female with 2 wk history of low back pain.  She has concern for kidney infection as she has had that in the past.  Her UA is clear today except for moderate blood due to her menstrual period.  Have sent urine for culture to be sure there is no infection.  She denies any mechanism of injury but I suspect this is musculoskeletal pain.  Strength, sensation, ROM, and DTRs all normal.  Will treat with ibuprofen 800mg  TID.  Assured patient that this is safe with breastfeeding.  Will follow-up on culture results and adjust therapy if necessary.  Patient will RTC if worsening or not improving.

## 2012-06-12 NOTE — Progress Notes (Signed)
Discussed, reviewed and agree.  

## 2012-06-12 NOTE — Patient Instructions (Addendum)
Dolor de espalda en el adulto (Back Pain, Adult)  El dolor de cintura es frecuente. Aproximadamente 1 de cada 5 personas lo sufren.La causa rara vez pone en peligro la vida. Con frecuencia mejora luego de algn tiempo.Alrededor de la mitad de las personas que sufren un inicio sbito de dolor de cintura, se sentirn mejor luego de 2 semanas. Aproximadamente 8 de cada 10 se sentirn mejor luego de 6 semanas.  CAUSAS  Algunas causas comunes son:   Distensin de los msculos o ligamentos que sostienen la columna vertebral.  Desgaste (degeneracin) de los discos vertebrales.  Artritis.  Traumatismos directos en la espalda. DIAGNSTICO  La mayor parte de las veces, la causa directa no se conoce.Sin embargo, el dolor puede tratarse efectivamente an cuando no se conozca la causa.Una de las formas ms precisas de asegurar que la causa del dolor no constituye un peligro es responder a las preguntas del mdico acerca de su salud y sus sntomas. Si el mdico necesita ms informacin, podr indicar anlisis de laboratorio o realizar un diagnstico por imgenes (radiografas o resonancia magntica).Sin embargo, aunque las imgenes muestren modificaciones, generalmente no es necesaria la ciruga.  INSTRUCCIONES PARA EL CUIDADO EN EL HOGAR  En algunas personas, el dolor de espalda vuelve.Como rara vez es peligroso, los pacientes pueden aprender a manejarlo ellos mismos.   Mantngase activo. Si permanece sentado o de pie mucho tiempo en el mismo lugar, se tensiona la espalda.  No se siente, maneje ni se quede parado en un mismo lugar por ms de 30 minutos. Realice caminatas cortas en superficies planas ni bien el dolor haya cedido. Trate de aumentar cada da el tiempo que camina .  No se quede en la cama.Si hace reposo durante ms de 1 o 2 das, puede retrasar la recuperacin.  No evite los ejercicios ni el trabajo.El cuerpo est hecho para moverse.No es peligroso estar activo, aunque le duela la  espalda.La espalda se curar ms rpido si contina sus actividades antes de que el dolor se vaya.  Preste atencin a su cuerpo cuando se incline y se levante. Muchas personas sienten menos molestias cuando levantan objetos si doblan las rodillas, mantienen la carga cerca del cuerpo y evitan torcerse. Generalmente, las posiciones ms cmodas son las que ejercen menos tensin en la espalda en recuperacin.  Encuentre una posicin cmoda para dormir. Utilice un colchn firme y recustese de costado. Doble ligeramente sus rodillas. Si se recuesta sobre su espalda, coloque una almohada debajo de sus rodillas.  Tome slo medicamentos de venta libre o recetados, segn las indicaciones del mdico. Los medicamentos de venta libre para calmar el dolor y reducir la inflamacin, son los que en general ms ayudan.El mdico podr prescribirle relajantes musculares.Estos medicamentos calman el dolor de modo que pueda retornar a sus actividades normales y a realizar ejercicios saludables.  Aplique hielo sobre la zona lesionada.  Ponga el hielo en una bolsa plstica.  Colquese una toalla entre la piel y la bolsa de hielo.  Deje la bolsa de hielo durante 15 a 20 minutos 3 a 4 veces por da, durante los primeros 2  3 das. Luego podr alternar entre calor y hielo para reducir el dolor y los espasmos.  Consulte a su mdico si puede tratar de hacer ejercicios para la espalda y recibir un masaje suave. Pueden ser beneficiosos.  Evite sentirse ansioso o estresado.El estrs aumenta la tensin muscular y puede empeorar el dolor de espalda.Es importante reconocer cuando est ansioso o estresado y aprender la forma   de controlarlos.El ejercicio es una gran opcin. SOLICITE ATENCIN MDICA SI:   Siente un dolor que no se alivia con reposo o medicamentos.  El dolor no mejora en 1 semana.  Desarrolla nuevos sntomas.  No se siente bien en general. SOLICITE ATENCIN MDICA DE INMEDIATO SI:  Siente un dolor  que se irradia desde la espalda hacia sus piernas.  Desarrolla nuevos problemas en el intestino o la vejiga.  Siente debilidad o adormecimiento inusual en sus brazos o piernas.  Presenta nuseas o vmitos.  Presenta dolor abdominal.  Se siente desfalleciente. Document Released: 08/13/2005 Document Revised: 02/12/2012 ExitCare Patient Information 2013 [employer].  

## 2013-11-17 ENCOUNTER — Ambulatory Visit: Payer: Self-pay | Admitting: Physician Assistant

## 2013-11-17 VITALS — BP 118/74 | HR 76 | Temp 98.4°F | Resp 16 | Ht 59.5 in | Wt 123.0 lb

## 2013-11-17 DIAGNOSIS — R209 Unspecified disturbances of skin sensation: Secondary | ICD-10-CM

## 2013-11-17 DIAGNOSIS — R21 Rash and other nonspecific skin eruption: Secondary | ICD-10-CM

## 2013-11-17 DIAGNOSIS — R208 Other disturbances of skin sensation: Secondary | ICD-10-CM

## 2013-11-17 MED ORDER — PREDNISONE 20 MG PO TABS: ORAL_TABLET | ORAL | Status: DC

## 2013-11-17 MED ORDER — TRAMADOL HCL 50 MG PO TABS: 50.0000 mg | ORAL_TABLET | Freq: Three times a day (TID) | ORAL | Status: DC | PRN

## 2013-11-17 NOTE — Progress Notes (Signed)
Subjective:    Patient ID: Karen Gamble, female    DOB: 1986-01-23, 28 y.o.   MRN: [id number]  HPI   Karen Gamble is a pleasant 28 yr old female here with concern for a painful, itchy rash.  She reports the pain began about 2 wks ago, the rash start about 1 wk ago.  The rash is under the left armpit with some involvement of the left anterior chest.  It was worse initially but is still very painful and itchy.  She also has itching/pain at the left upper back - no rash there.  She has not been able to sleep to do the discomfort.  She denies systemic symptoms - no fever, chills, cough, congestion, belly pain.  She reports that she did have chicken pox as a child.  No other sick contacts.  She has been taking Tylenol for pain.    There is a language barrier - Charisse KlinefelterLily Medina, clerical team member assisted with interpretation  Additionally she complains of left ankle pain/bruising after a soccer injury 2 wks ago.  Able to bear weight and has full ROM but does have pain at the anterior ankle   Review of Systems  Constitutional: Negative for fever and chills.  HENT: Negative.   Respiratory: Negative.   Cardiovascular: Negative.   Gastrointestinal: Negative.   Musculoskeletal: Positive for arthralgias.  Skin: Positive for rash.       Objective:   Physical Exam  Vitals reviewed. Constitutional: She is oriented to person, place, and time. She appears well-developed and well-nourished. No distress.  HENT:  Head: Normocephalic and atraumatic.  Eyes: Conjunctivae are normal. No scleral icterus.  Cardiovascular: Normal rate, regular rhythm and normal heart sounds.   Pulmonary/Chest: Effort normal and breath sounds normal. She has no wheezes. She has no rales.  Abdominal: Soft. There is no tenderness.  Musculoskeletal:       Feet:  Bruising over left lateral ankle; non tender on exam; full ROM, able to bear full weight  Neurological: She is alert and oriented to person, place, and time.    Skin: Skin is warm and dry.     Erythematous papular rash at left upper inner arm - not clearly vesicular, but rash has been present for 1 wk already, no drainage or crusting; she has some erythematous linear lesions across left chest - but no papules or vesicles; pain and itching over left scapula but no skin changes  Psychiatric: She has a normal mood and affect. Her behavior is normal.       Assessment & Plan:  Rash and nonspecific skin eruption - Plan: predniSONE (DELTASONE) 20 MG tablet  Pain of skin - Plan: predniSONE (DELTASONE) 20 MG tablet, traMADol (ULTRAM) 50 MG tablet  Karen Gamble is a very pleasant [age] yr female here with 1 week of painful, itchy rash at her left upper arm.  She has had 2 wks of pain in this area as well as the left anterior chest and left scapula.  I suspect this may be zoster in the T2 distribution.  The pain/lesions do not cross the midline.  She has had no new contacts and no injury to the area.  At this point, Valtrex will not have benefit.  Will try a taper of oral steroids.  Tramadol prn pain.  If worsening or not improving in 1 wk, pt to RTC - would consider biopsy of rash  Discussed with pt that since she is able to bear weight and has minimal  pain, the ankle will likely heal with time.  I am happy to refer to ortho if she would like - she states her husband has an orthopedist and she will try to get in there.  I am happy to provide a referral if she needs it  Pt to call or RTC if worsening or not improving  E. Frances Furbish MHS, PA-C [facility] 3/24/20157:04 PM

## 2013-11-17 NOTE — Patient Instructions (Signed)
Take the prednisone (steroid) as directed.  Finish the full course  You can continue taking Tylenol.  Use the tramadol if needed - may make you sleepy  If not improving in 1 week, please come back  Try to make an appointment with your orthopedic doctor, if you need a referral, please let us know  Culebrilla (Shingles)  La culebrilla (herpes zoster) es una infeccin causada por el mismo virus que causa la varicela (varicela). La infeccin causa una erupcin y ampollas dolorosas llenas de lquido en la piel, las cuales se abren, forman [place] y [place] se curan. Puede ocurrir en cualquier rea del cuerpo, pero por lo general afecta slo un lado del cuerpo o del rostro. El dolor de la culebrilla suele durar alrededor de 1 mes. Sin embargo, International aid/development worker con herpes pueden Programmer, applications de larga duracin (crnico) en la zona del cuerpo afectada.  La culebrilla suele ocurrir muchos aos despus de que la persona tuvo varicela. Es ms frecuente:   En Financial planner de 50 aos.  En los que tienen el sistema inmunolgico debilitado, como en los enfermos con VIH, sida o cncer.  En pacientes que toman medicamentos que debilitan el sistema inmunolgico, como los medicamentos que se indican en caso de transplantes.  En personas sometidas a un gran estrs. CAUSAS  La causa de la culebrilla es el virus varicela zoster (VZV), que tambin causa la varicela. Despus de que una persona se infecta con el virus, Clinical research associate en su cuerpo durante aos en un estado inactivo (latente). Para causar la culebrilla, el virus se reactiva e irrumpe como una infeccin en una raz nerviosa.  El virus se puede transmitir de persona a persona (es Armed forces technical officer) a Games developer del contacto con las Designer, television/film set. Slo se contagiar a las personas que no han padecido varicela. Cuando estas personas tienen exposicin al virus, pueden sufrir varicela. No van a desarrollar culebrilla. Una vez que las ampollas cicatrizan,  la persona ya no [place] y no puede transmitir el virus a Economist.  SNTOMAS  La culebrilla aparece en etapas. Los sntomas iniciales pueden ser dolor, picazn y sensacin de hormigueo en un rea de la piel. Este dolor se describe generalmente como ardor, punzante o pulstil.En unos pocos das o semanas aparece una erupcin roja dolorosa en el rea donde se sinti dolor, picazn y hormigueo. La erupcin generalmente aparece en un lado del cuerpo en un patrn de bandas o semejante a una correa. Luego la erupcin por lo general se convierte en un grupo de ampollas llenas de lquido. Estas ampollas forman Deno Lunger y se secan en aproximadamente 2 a 3 semanas.  Con los sntomas iniciales, la erupcin o las ampollas tambin puede haber sntomas similares a la gripe. Estos pueden ser:   Grant Ruts.  Escalofros.  Dolor de [place].  Malestar estomacal. DIAGNSTICO  El mdico le har un examen de la piel para diagnosticar la [place]. Tambin podrn tomarle muestras de piel o de fluido de las ampollas. Esta muestra se examina bajo el microscopio o se enva al laboratorio para su anlisis posterior.  TRATAMIENTO  No existe un tratamiento especfico para la culebrilla. El Nurse, adult medicamentos para Human resources officer, recuperarse ms rpido y Automotive engineer problemas a Air cabin crew. Puede incluir medicamentos antivirales, anti-inflamatorios y analgsicos.  INSTRUCCIONES PARA EL CUIDADO EN EL HOGAR   Tome un bao de agua fra o aplique compresas fras en el rea de la erupcin o ampollas segn lo indicado. Esto disminuir Chief Technology Officer  y la picazn.   Tome slo medicamentos de venta libre o recetados, segn las indicaciones del mdico.   Haga reposo segn las indicaciones del mdico.  Mantenga la erupcin y las ampollas limpias con jabn suave y agua fresca o como lo indique su mdico.   No pellizque las ampollas ni se rasque en la zona de la erupcin. Aplique una crema para calmar la picazn o  cremas anestsicas en el rea afectada segn las indicaciones de su mdico.  Mantenga la erupcin cubierta con un vendaje suelto (apsito).  Evite el contacto con:  Bebs.   Mujeres embarazadas.  Nios con eczema.   Personas mayores que han recibido un transplante.   Personas con enfermedades crnicas, como leucemia y sida.  Use ropa holgada para ayudar a Engineer, materialsaliviar el dolor del roce con la erupcin.  Concurra puntualmente a las citas de control con el mdico.Si el rea afectada est en el rostro, podrn derivarlo a un especialista, como un mdico especialista en ojos (oftalmlogo) o en odo, nariz y Advertising copywritergarganta (otorrinolaringlogo). Cumplir con todas las citas de seguimiento lo ayudar a Environmental health practitionerevitar complicaciones en los ojos, el dolor crnico o la discapacidad. SOLICITE ATENCIN MDICA DE INMEDIATO SI:   Tiene dolor en el rostro, en la zona de los ojos o tiene prdida de sensibilidad en un lado del rostro.  Siente dolor o zumbido en el odo.  Tiene prdida del gusto.  El dolor no se alivia con los medicamentos prescritos.   La irritacin o la hinchazn se extienden.   Tiene ms dolor e inflamacin.   La afeccin empeora o ha [place].  Tiene fiebre o sntomas persistentes por ms de 2 a 3 das.  Tiene fiebre y los sntomas empeoran repentinamente. ASEGRESE DE QUE:   Comprende estas instrucciones.  Controlar su enfermedad.  Solicitar ayuda de inmediato si no mejora o si empeora. Document Released: 05/23/2005 Document Revised: 05/07/2012 [facility] Patient Information 2014 WinfredExitCare, MarylandLLC.

## 2014-05-30 ENCOUNTER — Emergency Department (HOSPITAL_COMMUNITY)
Admission: EM | Admit: 2014-05-30 | Discharge: 2014-05-30 | Disposition: A | Discharge status: Home / Self Care | Payer: Medicaid Other | Attending: Emergency Medicine | Admitting: Emergency Medicine

## 2014-05-30 ENCOUNTER — Encounter (HOSPITAL_COMMUNITY): Payer: Self-pay | Admitting: Emergency Medicine

## 2014-05-30 DIAGNOSIS — Z7952 Long term (current) use of systemic steroids: Secondary | ICD-10-CM | POA: Insufficient documentation

## 2014-05-30 DIAGNOSIS — R509 Fever, unspecified: Secondary | ICD-10-CM | POA: Insufficient documentation

## 2014-05-30 DIAGNOSIS — Z79891 Long term (current) use of opiate analgesic: Secondary | ICD-10-CM | POA: Insufficient documentation

## 2014-05-30 DIAGNOSIS — Z3202 Encounter for pregnancy test, result negative: Secondary | ICD-10-CM | POA: Insufficient documentation

## 2014-05-30 DIAGNOSIS — Z8619 Personal history of other infectious and parasitic diseases: Secondary | ICD-10-CM | POA: Insufficient documentation

## 2014-05-30 DIAGNOSIS — Z8632 Personal history of gestational diabetes: Secondary | ICD-10-CM | POA: Insufficient documentation

## 2014-05-30 DIAGNOSIS — M791 Myalgia, unspecified site: Secondary | ICD-10-CM

## 2014-05-30 DIAGNOSIS — M549 Dorsalgia, unspecified: Secondary | ICD-10-CM | POA: Insufficient documentation

## 2014-05-30 LAB — I-STAT CHEM 8, ED
BUN: 10 mg/dL (ref 6–23)
CALCIUM ION: 1.15 mmol/L (ref 1.12–1.23)
Chloride: 103 mEq/L (ref 96–112)
Creatinine, Ser: 0.7 mg/dL (ref 0.50–1.10)
Glucose, Bld: 113 mg/dL — ABNORMAL HIGH (ref 70–99)
HEMATOCRIT: 44 % (ref 36.0–46.0)
Hemoglobin: 15 g/dL (ref 12.0–15.0)
Potassium: 3.6 mEq/L — ABNORMAL LOW (ref 3.7–5.3)
Sodium: 139 mEq/L (ref 137–147)
TCO2: 24 mmol/L (ref 0–100)

## 2014-05-30 LAB — URINALYSIS, ROUTINE W REFLEX MICROSCOPIC
Bilirubin Urine: NEGATIVE
Glucose, UA: NEGATIVE mg/dL
Hgb urine dipstick: NEGATIVE
Ketones, ur: NEGATIVE mg/dL
LEUKOCYTES UA: NEGATIVE
NITRITE: NEGATIVE
PROTEIN: NEGATIVE mg/dL
Specific Gravity, Urine: 1.013 (ref 1.005–1.030)
UROBILINOGEN UA: 0.2 mg/dL (ref 0.0–1.0)
pH: 8 (ref 5.0–8.0)

## 2014-05-30 LAB — PREGNANCY, URINE: PREG TEST UR: NEGATIVE

## 2014-05-30 LAB — POC URINE PREG, ED: PREG TEST UR: NEGATIVE

## 2014-05-30 MED ORDER — OXYCODONE-ACETAMINOPHEN 5-325 MG PO TABS
1.0000 | ORAL_TABLET | Freq: Once | ORAL | Status: AC
  Administered 2014-05-30: 1 via ORAL
  Filled 2014-05-30: qty 1

## 2014-05-30 MED ORDER — ONDANSETRON 4 MG PO TBDP
4.0000 mg | ORAL_TABLET | Freq: Once | ORAL | Status: AC
  Administered 2014-05-30: 4 mg via ORAL
  Filled 2014-05-30: qty 1

## 2014-05-30 MED ORDER — OXYCODONE-ACETAMINOPHEN 5-325 MG PO TABS: 1.0000 | ORAL_TABLET | Freq: Four times a day (QID) | ORAL | Status: DC | PRN

## 2014-05-30 NOTE — ED Provider Notes (Signed)
CSN: [id number]     Arrival date & time 05/30/14  1713 History  This chart was scribed for non-physician practitioner, Dorena Dewiffany G. Neva SeatGreene, PA-C working with Rolan BuccoMelanie Belfi, MD by Gwenyth Oberatherine Macek, ED scribe. This patient was seen in room TR06C/TR06C and the patient's care was started at 5:46 PM.  Chief Complaint  Patient presents with  . Generalized Body Aches   The history is provided by the patient and the spouse. A language interpreter was used.   HPI Comments: Karen Gamble is a 28 y.o. female who presents to the Emergency Department complaining of gradually worsening, constant, generalized pain that started in her leg 3 days ago and spread all over her body. Pt notes subjective fever, chills and mild throat pain as associated symptoms. Pt noted little relief after she was administered Percocet in the ED. She has also tried Tramadol from Shingles she had 6 months ago with some relief to her symptoms. Pt denies cough, ear pain, nausea, vomiting, tick exposure, rash and congestion as associated symptoms. She also denies any recent falls. Pt notes difficultly sleeping for the last week due to the pain. She had similar pain and pain progression two years ago when she was pregnant. Pt does not currently have a PCP or health insurance. In 2010, pt was seen for throat infection in ED.     Past Medical History  Diagnosis Date  . No pertinent past medical history   . Gestational diabetes     2nd pregnancy   Past Surgical History  Procedure Laterality Date  . Cholecystectomy     History reviewed. No pertinent family history. History  Substance Use Topics  . Smoking status: Never Smoker   . Smokeless tobacco: Never Used  . Alcohol Use: Yes   OB History   Grav Para Term Preterm Abortions TAB SAB Ect Mult Living   3 3 3  0 0 0 0 0 0 3     Review of Systems  Constitutional: Positive for fever and chills.  HENT: Negative for congestion and ear pain.   Respiratory: Negative for cough.    Gastrointestinal: Negative for vomiting.  Musculoskeletal: Positive for arthralgias and back pain.  Skin: Negative for rash.  All other systems reviewed and are negative.   Allergies  Review of patient's allergies indicates no known allergies.  Home Medications   Prior to Admission medications   Medication Sig Start Date End Date Taking? Authorizing Provider  ibuprofen (ADVIL,MOTRIN) 800 MG tablet Take 1 tablet (800 mg total) by mouth every 8 (eight) hours as needed for pain. 06/12/12   Eleanore Delia ChimesE Egan, PA-C  oxyCODONE-acetaminophen (PERCOCET/ROXICET) 5-325 MG per tablet Take 1 tablet by mouth every 6 (six) hours as needed for severe pain. 05/30/14   Donivin Wirt Irine SealG Breeonna Mone, PA-C  predniSONE (DELTASONE) 20 MG tablet Take 3 PO QAM x3days, 2 PO QAM x3days, 1 PO QAM x3days 11/17/13   Eleanore E Debbra RidingEgan, PA-C  traMADol (ULTRAM) 50 MG tablet Take 1 tablet (50 mg total) by mouth every 8 (eight) hours as needed. 11/17/13   Eleanore E Egan, PA-C   BP 118/59  Pulse 92  Temp(Src) 98.6 F (37 C) (Oral)  Resp 16  SpO2 97% Physical Exam  Nursing note and vitals reviewed. Constitutional: She appears well-developed and well-nourished. No distress.  HENT:  Head: Normocephalic and atraumatic.  Right Ear: Tympanic membrane and ear canal normal.  Left Ear: Tympanic membrane and ear canal normal.  Mouth/Throat: No oropharyngeal exudate.  Eyes: Conjunctivae and EOM are  normal.  Neck: Neck supple. No tracheal deviation present.  Cardiovascular: Normal rate, regular rhythm and normal heart sounds.   Pulmonary/Chest: Effort normal and breath sounds normal. No respiratory distress. She has no wheezes.  Abdominal: Soft. Bowel sounds are normal. She exhibits no distension. There is no tenderness. There is no rebound and no guarding.  Musculoskeletal: She exhibits no edema and no tenderness.  Skin: Skin is warm and dry.  Psychiatric: She has a normal mood and affect. Her behavior is normal.    ED Course   Procedures (including critical care time) DIAGNOSTIC STUDIES: Oxygen Saturation is 100% on RA, normal by my interpretation.    COORDINATION OF CARE: Will order UA, I-stat Chem-8, Pregnancy test  7:37 PM Explained via translator that we did not determine cause of pain and advised her to follow-up with PCP. We did however, do basic lab work that did not show any gross abnormalities and her urine preg is negative. She was agreeable to seeing a PCP. Pts symptoms may be due to viral syndrome or potentially fibromyalgia. Discussed treatment plan with pt at bedside and pt agreed to plan.  Labs Review Labs Reviewed  I-STAT CHEM 8, ED - Abnormal; Notable for the following:    Potassium 3.6 (*)    Glucose, Bld 113 (*)    All other components within normal limits  URINALYSIS, ROUTINE W REFLEX MICROSCOPIC  PREGNANCY, URINE  POC URINE PREG, ED    Imaging Review No results found.   EKG Interpretation None      MDM   Final diagnoses:  Myalgia    28 y.o.Karen Gamble's evaluation in the Emergency Department is complete. It has been determined that no acute conditions requiring further emergency intervention are present at this time. The patient/guardian have been advised of the diagnosis and plan. We have discussed signs and symptoms that warrant return to the ED, such as changes or worsening in symptoms.  Vital signs are stable at discharge. Filed Vitals:   05/30/14 1951  BP: 118/59  Pulse: 92  Temp: 98.6 F (37 C)  Resp: 16    Patient/guardian has voiced understanding and agreed to follow-up with the PCP or specialist.  I personally performed the services described in this documentation, which was scribed in my presence. The recorded information has been reviewed and is accurate.   Dorthula Matas, PA-C 06/01/14 2258

## 2014-05-30 NOTE — ED Notes (Signed)
She states shes had body aches and fevers x 4 days. She denies bowel/bladder changes, denies cough. She took ibuprofen at 0930 this am with some relief of symptoms

## 2014-05-30 NOTE — Discharge Instructions (Signed)
Dolor muscular (Muscle Pain) Las causas del dolor muscular (mialgia) pueden ser muchas, entre ellas:  Uso excesivo del msculo o distensin muscular, en especial si la persona no est en buen estado fsico. Esta es la causa ms comn del dolor muscular.  Lesiones.  Moretones.  Virus, como el de la gripe.  Enfermedades infecciosas.  Fibromialgia, que es una afeccin crnica que causa dolor de cabeza, fatiga y dolor muscular con la palpacin.  Enfermedades autoinmunes, como el lupus.  Determinados medicamentos, como los inhibidores de la ECA y las estatinas. El dolor muscular puede ser leve o intenso. La mayora de las veces, el dolor dura solo un corto perodo y desaparece sin tratamiento. Para diagnosticar la causa del dolor muscular, el mdico le preguntar los antecedentes mdicos. Esto significa que le preguntar cundo comenz el dolor muscular y qu ha sucedido desde ese momento. Si el dolor muscular no comenz hace mucho tiempo, el mdico puede esperar antes de hacer diversas pruebas. Si el dolor muscular comenz hace mucho tiempo, el mdico puede decidir que es ms conveniente hacer pruebas de inmediato. Si el mdico cree que el dolor muscular puede estar causado por una enfermedad, es posible que necesite hacer pruebas adicionales para descartar determinadas afecciones.  El tratamiento del dolor muscular depende de la causa. El cuidado en el hogar a menudo es suficiente para aliviar el dolor muscular. El mdico tambin puede recetarle un medicamento antinflamatorio. INSTRUCCIONES PARA EL CUIDADO EN EL HOGAR Controle su afeccin para ver si hay cambios. Las siguientes indicaciones ayudarn a aliviar cualquier molestia que pueda sentir:  Tome los medicamentos de venta libre o recetados solamente segn las indicaciones del mdico.  Aplique hielo en el msculo dolorido:  Ponga el hielo en una bolsa plstica.  Colquese una toalla entre la piel y la bolsa de hielo.  Aplique el  hielo de 3 a 4 veces por da durante 15 a 20minutos.  Puede alternar la colocacin de compresas calientes y fras en el msculo segn lo que le haya indicado el mdico.  Si el uso excesivo del msculo le est causando dolor muscular, disminuya las actividades hasta que el dolor desaparezca.  Recuerde que es normal sentir algo de dolor muscular despus de comenzar un programa de entrenamiento. Generalmente duelen aquellos msculos que no se utilizan con frecuencia.  Si usted no hace actividad fsica con frecuencia, los ejercicios deben ser suaves y regulares.  Para reducir el riesgo de dolor muscular, haga un precalentamiento antes de la actividad fsica.  No siga haciendo actividad fsica si el dolor es muy intenso. Este tipo de dolor podra indicar que se ha lesionado un msculo. SOLICITE ATENCIN MDICA SI:  El dolor muscular empeora y los medicamentos no surten efecto.  Tiene dolor muscular que dura ms de 3das.  Tiene una erupcin cutnea o fiebre junto con el dolor muscular.  Tiene dolor muscular despus de una picadura de garrapata.  Tiene dolor muscular mientras hace actividad fsica, aunque est en buen estado fsico.  Tiene enrojecimiento, sensibilidad o hinchazn junto con el dolor muscular.  Tiene dolor muscular despus de comenzar un medicamento nuevo o de cambiar la dosis de un medicamento. SOLICITE ATENCIN MDICA DE INMEDIATO SI:  Tiene dificultad para respirar.  Presenta dificultad para tragar.  Tiene dolor muscular junto con rigidez en el cuello, fiebre y vmitos.  Tiene debilidad muscular intensa o no puede mover una parte del cuerpo. ASEGRESE DE QUE:   Comprende estas instrucciones.  Controlar su afeccin.  Recibir ayuda de inmediato   si no mejora o si empeora. Document Released: 11/20/2007 Document Revised: 08/18/2013 [employer] Patient Information 2015 [employer]. This information is not intended to replace advice given to you by your health  care provider. Make sure you discuss any questions you have with your health care provider.  

## 2014-06-02 ENCOUNTER — Ambulatory Visit (INDEPENDENT_AMBULATORY_CARE_PROVIDER_SITE_OTHER): Payer: Self-pay

## 2014-06-02 ENCOUNTER — Ambulatory Visit (INDEPENDENT_AMBULATORY_CARE_PROVIDER_SITE_OTHER): Payer: Self-pay | Admitting: Family Medicine

## 2014-06-02 VITALS — BP 118/82 | HR 96 | Temp 98.4°F | Resp 20 | Ht 61.0 in | Wt 126.4 lb

## 2014-06-02 DIAGNOSIS — R5081 Fever presenting with conditions classified elsewhere: Secondary | ICD-10-CM

## 2014-06-02 DIAGNOSIS — R52 Pain, unspecified: Secondary | ICD-10-CM

## 2014-06-02 DIAGNOSIS — M545 Low back pain, unspecified: Secondary | ICD-10-CM

## 2014-06-02 DIAGNOSIS — R1013 Epigastric pain: Secondary | ICD-10-CM

## 2014-06-02 DIAGNOSIS — G8929 Other chronic pain: Secondary | ICD-10-CM

## 2014-06-02 LAB — POCT URINALYSIS DIPSTICK
Blood, UA: NEGATIVE
Glucose, UA: NEGATIVE
KETONES UA: 40
Nitrite, UA: NEGATIVE
PROTEIN UA: NEGATIVE
SPEC GRAV UA: 1.025
Urobilinogen, UA: 0.2
pH, UA: 5.5

## 2014-06-02 LAB — POCT CBC
GRANULOCYTE PERCENT: 60.2 % (ref 37–80)
HEMATOCRIT: 45.3 % (ref 37.7–47.9)
Hemoglobin: 14.8 g/dL (ref 12.2–16.2)
Lymph, poc: 2.2 (ref 0.6–3.4)
MCH, POC: 30.4 pg (ref 27–31.2)
MCHC: 32.7 g/dL (ref 31.8–35.4)
MCV: 93.1 fL (ref 80–97)
MID (cbc): 0.3 (ref 0–0.9)
MPV: 8.9 fL (ref 0–99.8)
PLATELET COUNT, POC: 239 10*3/uL (ref 142–424)
POC GRANULOCYTE: 3.9 (ref 2–6.9)
POC LYMPH PERCENT: 34.9 %L (ref 10–50)
POC MID %: 4.9 %M (ref 0–12)
RBC: 4.86 M/uL (ref 4.04–5.48)
RDW, POC: 12.8 %
WBC: 6.4 10*3/uL (ref 4.6–10.2)

## 2014-06-02 LAB — POCT UA - MICROSCOPIC ONLY
CRYSTALS, UR, HPF, POC: NEGATIVE
Casts, Ur, LPF, POC: NEGATIVE
Mucus, UA: NEGATIVE
Yeast, UA: NEGATIVE

## 2014-06-02 MED ORDER — GI COCKTAIL ~~LOC~~
30.0000 mL | Freq: Once | ORAL | Status: AC
  Administered 2014-06-02: 30 mL via ORAL

## 2014-06-02 MED ORDER — MELOXICAM 7.5 MG PO TABS: 7.5000 mg | ORAL_TABLET | Freq: Every day | ORAL | Status: DC

## 2014-06-02 MED ORDER — CEPHALEXIN 500 MG PO CAPS: 500.0000 mg | ORAL_CAPSULE | Freq: Two times a day (BID) | ORAL | Status: DC

## 2014-06-02 NOTE — Patient Instructions (Signed)
Use the keflex twice a day for one week for possible UTI.  You can also use meloxicam as needed for pain.  I will be in touch with the rest of your labs, let us know if you are getting worse!

## 2014-06-02 NOTE — Progress Notes (Addendum)
[facility] [address] [place] [address] [phone]  Date:  06/02/2014   Name:  Karen Gamble   DOB:  [birth year]   MRN:  [id number]  PCP:  No PCP Per Patient    Chief Complaint: Generalized Body Aches   History of Present Illness:  Karen Gamble is a 28 y.o. very pleasant female patient who presents with the following:  She is here today with "pain all over" since last week.  Mild sore throat, no earache. No coguh.  She notes some epigastric pain. She was seen at the ED on 10/4 with generalized myalgias, treated with percocet. Her work- up was negative and she was released to home.  Her LMP was in September but she does have implanon that was placed last year.   The main issue seems to be that her back hurts.  She did not take any medications today.  She notes that percocet seemed to make her stomach hurt so she is not taking it currently  She notes that she and her husband had a few beers on 10/2 to celebrate their anniversary- the pain seeemd to start after this.   Results for orders placed during the hospital encounter of 05/30/14  URINALYSIS, ROUTINE W REFLEX MICROSCOPIC      Result Value Ref Range   Color, Urine YELLOW  YELLOW   APPearance CLEAR  CLEAR   Specific Gravity, Urine 1.013  1.005 - 1.030   pH 8.0  5.0 - 8.0   Glucose, UA NEGATIVE  NEGATIVE mg/dL   Hgb urine dipstick NEGATIVE  NEGATIVE   Bilirubin Urine NEGATIVE  NEGATIVE   Ketones, ur NEGATIVE  NEGATIVE mg/dL   Protein, ur NEGATIVE  NEGATIVE mg/dL   Urobilinogen, UA 0.2  0.0 - 1.0 mg/dL   Nitrite NEGATIVE  NEGATIVE   Leukocytes, UA NEGATIVE  NEGATIVE  PREGNANCY, URINE      Result Value Ref Range   Preg Test, Ur NEGATIVE  NEGATIVE  I-STAT CHEM 8, ED      Result Value Ref Range   Sodium 139  137 - 147 mEq/L   Potassium 3.6 (*) 3.7 - 5.3 mEq/L   Chloride 103  96 - 112 mEq/L   BUN 10  6 - 23 mg/dL   Creatinine, Ser 2.95  0.50 - 1.10 mg/dL   Glucose, Bld 621 (*)  70 - 99 mg/dL   Calcium, Ion 3.08  6.57 - 1.23 mmol/L   TCO2 24  0 - 100 mmol/L   Hemoglobin 15.0  12.0 - 15.0 g/dL   HCT 84.6  96.2 - 95.2 %  POC URINE PREG, ED      Result Value Ref Range   Preg Test, Ur NEGATIVE  NEGATIVE     Patient Active Problem List   Diagnosis Date Noted  . Gestational diabetes mellitus, antepartum 12/13/2011  . Pregnancy, supervision, high-risk 09/12/2011  . History of gestational diabetes in prior pregnancy, currently pregnant 09/12/2011  . Cataract of left eye 07/18/2011    Past Medical History  Diagnosis Date  . No pertinent past medical history   . Gestational diabetes     2nd pregnancy    Past Surgical History  Procedure Laterality Date  . Cholecystectomy      History  Substance Use Topics  . Smoking status: Never Smoker   . Smokeless tobacco: Never Used  . Alcohol Use: Yes    History reviewed. No pertinent family history.  No Known Allergies  Medication list has been reviewed and updated.  No current outpatient prescriptions on file prior to visit.   No current facility-administered medications on file prior to visit.    Review of Systems:  As per HPI- otherwise negative.   Physical Examination: Filed Vitals:   06/02/14 0930  BP: 118/82  Pulse: 106  Temp: 98.4 F (36.9 C)  Resp: 20   Filed Vitals:   06/02/14 0930  Height: 5\' 1"  (1.549 m)  Weight: 126 lb 6.4 oz (57.335 kg)   Body mass index is 23.9 kg/(m^2). Ideal Body Weight: Weight in (lb) to have BMI = 25: 132  GEN: WDWN, NAD, Non-toxic, A & O x 3, appears well HEENT: Atraumatic, Normocephalic. Neck supple. No masses, No LAD. Ears and Nose: No external deformity. CV: RRR, No M/G/R. No JVD. No thrill. No extra heart sounds. PULM: CTA B, no wheezes, crackles, rhonchi. No retractions. No resp. distress. No accessory muscle use. ABD: S, NT, ND, +BS. No rebound. No HSM. EXTR: No c/c/e NEURO Normal gait.  PSYCH: Normally interactive. Conversant. Not depressed  or anxious appearing.  Calm demeanor.  There is nexplanon present in her left upper inner arm- palpable on exam.  She endorses tenderness over her entire back, and mild epigastric pain   Results for orders placed in visit on 06/02/14  POCT CBC      Result Value Ref Range   WBC 6.4  4.6 - 10.2 K/uL   Lymph, poc 2.2  0.6 - 3.4   POC LYMPH PERCENT 34.9  10 - 50 %L   MID (cbc) 0.3  0 - 0.9   POC MID % 4.9  0 - 12 %M   POC Granulocyte 3.9  2 - 6.9   Granulocyte percent 60.2  37 - 80 %G   RBC 4.86  4.04 - 5.48 M/uL   Hemoglobin 14.8  12.2 - 16.2 g/dL   HCT, POC 16.145.3  09.637.7 - 47.9 %   MCV 93.1  80 - 97 fL   MCH, POC 30.4  27 - 31.2 pg   MCHC 32.7  31.8 - 35.4 g/dL   RDW, POC 04.512.8     Platelet Count, POC 239  142 - 424 K/uL   MPV 8.9  0 - 99.8 fL  POCT UA - MICROSCOPIC ONLY      Result Value Ref Range   WBC, Ur, HPF, POC 1-4     RBC, urine, microscopic 0-1     Bacteria, U Microscopic trace     Mucus, UA neg     Epithelial cells, urine per micros 3-4     Crystals, Ur, HPF, POC neg     Casts, Ur, LPF, POC neg     Yeast, UA neg    POCT URINALYSIS DIPSTICK      Result Value Ref Range   Color, UA yellow     Clarity, UA hazy     Glucose, UA neg     Bilirubin, UA small     Ketones, UA 40     Spec Grav, UA 1.025     Blood, UA neg     pH, UA 5.5     Protein, UA neg     Urobilinogen, UA 0.2     Nitrite, UA neg     Leukocytes, UA Trace     [facility] reading (PRIMARY) by  Dr. Patsy Lageropland. CXR: negative  CHEST 2 VIEW  COMPARISON: Chest x-ray of 08/24/2009  FINDINGS: No active infiltrate or effusion is seen.  Mediastinal and hilar contours appear normal. The heart is within normal limits in size. No bony abnormality is seen.  IMPRESSION: No active cardiopulmonary disease.  She took a GI cocktail that did help her abdominal discomfort  Assessment and Plan: Bilateral low back pain without sciatica - Plan: cephALEXin (KEFLEX) 500 MG capsule, meloxicam (MOBIC) 7.5 MG tablet, Urine  culture  Body aches - Plan: POCT CBC, POCT UA - Microscopic Only, POCT urinalysis dipstick  Fever presenting with conditions classified elsewhere - Plan: DG Chest 2 View  Abdominal pain, chronic, epigastric - Plan: gi cocktail (Maalox,Lidocaine,Donnatal)  Vague abdominal and back pain.  So far her evaluation has been reassuring.  Possible UTI- she would like to try treating for this while we await urine culture.  Will treat with keflex and mobic, and she is to let me know if she is feeling worse or no better in the next couple of days   Signed Abbe Amsterdam, MD  10/12: on keflex for UTI  Urine culture showed coag negative staph which is susceptible to keflex.  Called and LMOM with this info- please let me know if not better

## 2014-06-05 LAB — URINE CULTURE

## 2014-06-07 NOTE — ED Provider Notes (Signed)
Medical screening examination/treatment/procedure(s) were performed by non-physician practitioner and as supervising physician I was immediately available for consultation/collaboration.   EKG Interpretation None        Rolan BuccoMelanie Paolo Okane, MD 06/07/14 1009

## 2015-04-17 ENCOUNTER — Ambulatory Visit (INDEPENDENT_AMBULATORY_CARE_PROVIDER_SITE_OTHER): Payer: Self-pay | Admitting: Internal Medicine

## 2015-04-17 ENCOUNTER — Ambulatory Visit (INDEPENDENT_AMBULATORY_CARE_PROVIDER_SITE_OTHER): Payer: Self-pay

## 2015-04-17 VITALS — BP 116/70 | HR 66 | Temp 98.2°F | Resp 18 | Ht 61.0 in | Wt 134.0 lb

## 2015-04-17 DIAGNOSIS — R0789 Other chest pain: Secondary | ICD-10-CM

## 2015-04-17 DIAGNOSIS — R1013 Epigastric pain: Secondary | ICD-10-CM

## 2015-04-17 DIAGNOSIS — R109 Unspecified abdominal pain: Secondary | ICD-10-CM

## 2015-04-17 LAB — COMPREHENSIVE METABOLIC PANEL
ALBUMIN: 4.4 g/dL (ref 3.6–5.1)
ALT: 15 U/L (ref 6–29)
AST: 16 U/L (ref 10–30)
Alkaline Phosphatase: 49 U/L (ref 33–115)
BUN: 9 mg/dL (ref 7–25)
CHLORIDE: 101 mmol/L (ref 98–110)
CO2: 26 mmol/L (ref 20–31)
CREATININE: 0.69 mg/dL (ref 0.50–1.10)
Calcium: 9.6 mg/dL (ref 8.6–10.2)
Glucose, Bld: 91 mg/dL (ref 65–99)
Potassium: 5 mmol/L (ref 3.5–5.3)
SODIUM: 139 mmol/L (ref 135–146)
Total Bilirubin: 0.4 mg/dL (ref 0.2–1.2)
Total Protein: 7.5 g/dL (ref 6.1–8.1)

## 2015-04-17 LAB — POCT CBC
GRANULOCYTE PERCENT: 53.8 % (ref 37–80)
HEMATOCRIT: 40.4 % (ref 37.7–47.9)
HEMOGLOBIN: 12.6 g/dL (ref 12.2–16.2)
Lymph, poc: 2.1 (ref 0.6–3.4)
MCH: 28 pg (ref 27–31.2)
MCHC: 31.3 g/dL — AB (ref 31.8–35.4)
MCV: 89.3 fL (ref 80–97)
MID (cbc): 0.6 (ref 0–0.9)
MPV: 8.4 fL (ref 0–99.8)
POC GRANULOCYTE: 3.1 (ref 2–6.9)
POC LYMPH %: 36.5 % (ref 10–50)
POC MID %: 9.7 %M (ref 0–12)
Platelet Count, POC: 285 10*3/uL (ref 142–424)
RBC: 4.52 M/uL (ref 4.04–5.48)
RDW, POC: 13.3 %
WBC: 5.7 10*3/uL (ref 4.6–10.2)

## 2015-04-17 MED ORDER — MELOXICAM 7.5 MG PO TABS: 7.5000 mg | ORAL_TABLET | Freq: Every day | ORAL | Status: DC

## 2015-04-17 MED ORDER — CYCLOBENZAPRINE HCL 10 MG PO TABS: 10.0000 mg | ORAL_TABLET | Freq: Every day | ORAL | Status: DC

## 2015-04-17 MED ORDER — FAMOTIDINE 20 MG PO TABS: 20.0000 mg | ORAL_TABLET | Freq: Two times a day (BID) | ORAL | Status: DC

## 2015-04-17 NOTE — Progress Notes (Addendum)
Subjective:  This chart was scribed for Karen Sia, MD by [employer], medical scribe at [facility].The patient was seen in exam room ** and the patient's care was started at 8:57 AM.    Patient ID: Karen Gamble, female    DOB: 1985/11/04, 29 y.o.   MRN: [id number] Chief Complaint  Patient presents with  . Chest Pain    3 x weeks  . Back Pain    3 days  . Shortness of Breath   HPI HPI Comments: Karen Gamble is a 29 y.o. female who presents to [facility] complaining of chest pain for three weeks that radiates to her back. Chest pain worsens shortly after eating. Pain lasts for five minutes and makes her nauseous. When the pain starts she feels short of breath. She has noticed heart palpations. This pain does not wake her at night. Only takes vitamins. No changes in her appetite or weight loss. She denies fever. No chance of pregnancy.  She also complains of a constant lower back pain for the past three days, no pain when bending over. She does have this pain as night when she is sleeping. No urinary symptoms.  Her son was present to translate for the patient.  Past Medical History  Diagnosis Date  . No pertinent past medical history   . Gestational diabetes     2nd pregnancy   Prior to Admission medications   Medication Sig Start Date End Date Taking? Authorizing Provider  cephALEXin (KEFLEX) 500 MG capsule Take 1 capsule (500 mg total) by mouth 2 (two) times daily. Patient not taking: Reported on 04/17/2015 06/02/14   Pearline Cables, MD  meloxicam (MOBIC) 7.5 MG tablet Take 1 tablet (7.5 mg total) by mouth daily. May take 2 if needed for pain Patient not taking: Reported on 04/17/2015 06/02/14   Pearline Cables, MD   No Known Allergies  Review of Systems  Constitutional: Negative for appetite change.  Respiratory: Positive for shortness of breath.   Cardiovascular: Positive for chest pain.    Gastrointestinal: Positive for nausea.  Genitourinary: Negative for dysuria, urgency and frequency.  Musculoskeletal: Positive for back pain.      Objective:  BP 116/70 mmHg  Pulse 66  Temp(Src) 98.2 F (36.8 C) (Oral)  Resp 18  Ht 5\' 1"  (1.549 m)  Wt 134 lb (60.782 kg)  BMI 25.33 kg/m2  SpO2 98%  LMP 04/04/2015 Physical Exam  Constitutional: She is oriented to person, place, and time. She appears well-developed and well-nourished. No distress.  HENT:  Head: Normocephalic and atraumatic.  Eyes: Pupils are equal, round, and reactive to light.  Neck: Normal range of motion.  Cardiovascular: Normal rate and regular rhythm.   Pulmonary/Chest: Effort normal. No respiratory distress.  Musculoskeletal: Normal range of motion.  Neurological: She is alert and oriented to person, place, and time.  Skin: Skin is warm and dry.  Psychiatric: She has a normal mood and affect. Her behavior is normal.  Nursing note and vitals reviewed. [facility] reading (PRIMARY) by  Dr.Arine Foley=NAD Results for orders placed or performed in visit on 04/17/15  POCT CBC  Result Value Ref Range   WBC 5.7 4.6 - 10.2 K/uL   Lymph, poc 2.1 0.6 - 3.4   POC LYMPH PERCENT 36.5 10 - 50 %L   MID (cbc) 0.6 0 - 0.9   POC MID % 9.7 0 - 12 %M   POC Granulocyte 3.1 2 - 6.9  Granulocyte percent 53.8 37 - 80 %G   RBC 4.52 4.04 - 5.48 M/uL   Hemoglobin 12.6 12.2 - 16.2 g/dL   HCT, POC 16.1 09.6 - 47.9 %   MCV 89.3 80 - 97 fL   MCH, POC 28.0 27 - 31.2 pg   MCHC 31.3 (A) 31.8 - 35.4 g/dL   RDW, POC 04.5 %   Platelet Count, POC 285 142 - 424 K/uL   MPV 8.4 0 - 99.8 fL        Assessment & Plan:  Chest wall pain - ?costochondritis  -heat/stretc/mobic with food  Epigastric pain and Dyspepsia  -?gerd/gastritis or possibly gallstones  cmet  Famotidine 20bid  Flank pain  Muscular--flex hs/stretch  Gave info span and eng Meds ordered this encounter  Medications  . famotidine (PEPCID) 20 MG tablet    Sig: Take  1 tablet (20 mg total) by mouth 2 (two) times daily. For stomach    Dispense:  60 tablet    Refill:  1  . cyclobenzaprine (FLEXERIL) 10 MG tablet    Sig: Take 1 tablet (10 mg total) by mouth at bedtime. For back pain    Dispense:  30 tablet    Refill:  0  . meloxicam (MOBIC) 7.5 MG tablet    Sig: Take 1 tablet (7.5 mg total) by mouth daily. Take with food at mealtime for the chest pain.    Dispense:  30 tablet    Refill:  0     Will arrange f/u after labs w span spk provid I have completed the patient encounter in its entirety as documented by the scribe, with editing by me where necessary. Agnes Probert P. Merla Riches, M.D.

## 2015-04-17 NOTE — Patient Instructions (Signed)
Costochondritis Costochondritis, sometimes called Tietze syndrome, is a swelling and irritation (inflammation) of the tissue (cartilage) that connects your ribs with your breastbone (sternum). It causes pain in the chest and rib area. Costochondritis usually goes away on its own over time. It can take up to 6 weeks or longer to get better, especially if you are unable to limit your activities. CAUSES  Some cases of costochondritis have no known cause. Possible causes include:  Injury (trauma).  Exercise or activity such as lifting.  Severe coughing. SIGNS AND SYMPTOMS  Pain and tenderness in the chest and rib area.  Pain that gets worse when coughing or taking deep breaths. Pain that gets worse with specific movements.Dolor de espalda en el adulto (Back Pain, Adult)  El dolor de cintura es frecuente. Aproximadamente 1 de cada 5 personas lo sufren.La causa rara vez pone en peligro la vida. Con frecuencia mejora luego de algn tiempo.Alrededor de la mitad de las personas que sufren un inicio sbito de dolor de cintura, se sentirn mejor luego de 2 semanas. Aproximadamente 8 de cada 10 se sentirn mejor luego de 6 semanas.  CAUSAS  Algunas causas comunes son:   Distensin de los msculos o ligamentos que sostienen la columna vertebral.  Desgaste (degeneracin) de los discos vertebrales.  Artritis.  Traumatismos directos en la espalda. DIAGNSTICO  La mayor parte de las veces, la causa directa no se conoce.Sin embargo, Chief Technology Officer puede tratarse efectivamente an cuando no se Best boy.Una de las formas ms precisas de asegurar que la causa del dolor no constituye un peligro es responder a las preguntas del mdico acerca de su salud y sus sntomas. Si el mdico necesita ms informacin, podr indicar anlisis de laboratorio o Education officer, environmental un diagnstico por imgenes (radiografas o Health visitor).Sin embargo, aunque las [employer] modificaciones, generalmente no es  necesaria la [place].  INSTRUCCIONES PARA EL CUIDADO EN EL HOGAR  En algunas personas, el dolor de espalda vuelve.Como rara vez es peligroso, los pacientes pueden aprender a Interior and spatial designer.   Mantngase activo. Si permanece sentado o de pie mucho tiempo en el mismo lugar, se tensiona la espalda.  No se siente, maneje ni se quede parado en un mismo lugar por ms de 30 minutos. Realice caminatas cortas en superficies planas ni bien el dolor haya cedido. Trate de Copy tiempo que camina .  No se quede en la cama.Si hace reposo durante ms de 1 o 2 das, puede Estate agent.  No evite los ejercicios ni el trabajo.El cuerpo est hecho para moverse.No es peligroso estar [place], aunque le duela la espalda.La espalda se curar ms rpido si contina sus actividades antes de que el dolor se vaya.  Preste atencin a su cuerpo cuando se incline y se levante. Muchas personas sienten menos molestias cuando levantan objetos si doblan las rodillas, mantienen la carga cerca del cuerpo y evitan torcerse. Generalmente, las posiciones ms cmodas son las que ejercen menos tensin en la espalda en recuperacin.  Encuentre una posicin cmoda para dormir. Utilice un colchn firme y recustese de [place]. Doble ligeramente sus rodillas. Si se recuesta sobre su espalda, coloque una almohada debajo de sus rodillas.  Tome slo medicamentos de venta libre o recetados, segn las indicaciones del mdico. Los medicamentos de venta libre para Primary school teacher y reducir Futures trader, son los que en general ms ayudan.El mdico podr prescribirle relajantes musculares.Estos medicamentos calman el dolor de modo que pueda retornar a sus actividades normales y a  realizar ejercicios saludables.  Aplique hielo sobre la zona lesionada.  Ponga el hielo en una bolsa plstica.  Colquese una toalla entre la piel y la bolsa de hielo.  Deje la bolsa de hielo durante 15 a 20 minutos 3 a 4 veces  por da, durante los primeros 2  3 das. Luego podr alternar Eusebio Me calor y hielo para reducir Chief Technology Officer y los espasmos.  Consulte a su mdico si puede tratar de hacer ejercicios para la espalda y recibir un masaje suave. Pueden ser beneficiosos.  Evite sentirse ansioso o estresado.El estrs aumenta la tensin muscular y puede empeorar el dolor de espalda.Es importante reconocer cuando est ansioso o estresado y aprender la forma de controlarlos.El ejercicio es una gran opcin. SOLICITE ATENCIN MDICA SI:   Siente un dolor que no se alivia con reposo o medicamentos.  El dolor no mejora en 1 semana.  Desarrolla nuevos sntomas.  No se siente bien en general. SOLICITE ATENCIN MDICA DE INMEDIATO SI:  Siente un dolor que se irradia desde la espalda hacia sus piernas.  Desarrolla nuevos problemas en el intestino o la vejiga.  Siente debilidad o adormecimiento inusual en sus brazos o piernas.  Presenta nuseas o vmitos.  Presenta dolor abdominal.  Se siente desfalleciente. Document Released: 08/13/2005 Document Revised: 02/12/2012 [facility] Patient Information 2015 [place], [name]. This information is not intended to replace advice given to you by your health care provider. Make sure you discuss any questions you have with your health care provider. Back Pain, Adult Low back pain is very common. About 1 in 5 people have back pain.The cause of low back pain is rarely dangerous. The pain often gets better over time.About half of people with a sudden onset of back pain feel better in just 2 weeks. About 8 in 10 people feel better by 6 weeks.  CAUSES Some common causes of back pain include:  Strain of the muscles or ligaments supporting the spine.  Wear and tear (degeneration) of the spinal discs.  Arthritis.  Direct injury to the back. DIAGNOSIS Most of the time, the direct cause of low back pain is not known.However, back pain can be treated effectively even when the exact  cause of the pain is unknown.Answering your caregiver's questions about your overall health and symptoms is one of the most accurate ways to make sure the cause of your pain is not dangerous. If your caregiver needs more information, he or she may order lab work or imaging tests (X-rays or MRIs).However, even if imaging tests show changes in your back, this usually does not require surgery. HOME CARE INSTRUCTIONS For many people, back pain returns.Since low back pain is rarely dangerous, it is often a condition that people can learn to [facility] their own.   Remain active. It is stressful on the back to sit or stand in one place. Do not sit, drive, or stand in one place for more than 30 minutes at a time. Take short walks on level surfaces as soon as pain allows.Try to increase the length of time you walk each day.  Do not stay in bed.Resting more than 1 or 2 days can delay your recovery.  Do not avoid exercise or work.Your body is made to move.It is not dangerous to be active, even though your back may hurt.Your back will likely heal faster if you return to being active before your pain is gone.  Pay attention to your body when you bend and lift. Many people have less discomfortwhen lifting  if they bend their knees, keep the load close to their bodies,and avoid twisting. Often, the most comfortable positions are those that put less stress on your recovering back.  Find a comfortable position to sleep. Use a firm mattress and lie on your side with your knees slightly bent. If you lie on your back, put a pillow under your knees.  Only take over-the-counter or prescription medicines as directed by your caregiver. Over-the-counter medicines to reduce pain and inflammation are often the most helpful.Your caregiver may prescribe muscle relaxant drugs.These medicines help dull your pain so you can more quickly return to your normal activities and healthy exercise.  Put ice on the injured  area.  Put ice in a plastic bag.  Place a towel between your skin and the bag.  Leave the ice on for 15-20 minutes, 03-04 times a day for the first 2 to 3 days. After that, ice and heat may be alternated to reduce pain and spasms.  Ask your caregiver about trying back exercises and gentle massage. This may be of some benefit.  Avoid feeling anxious or stressed.Stress increases muscle tension and can worsen back pain.It is important to recognize when you are anxious or stressed and learn ways to manage it.Exercise is a great option. SEEK MEDICAL CARE IF:  You have pain that is not relieved with rest or medicine.  You have pain that does not improve in 1 week.  You have new symptoms.  You are generally not feeling well. SEEK IMMEDIATE MEDICAL CARE IF:   You have pain that radiates from your back into your legs.  You develop new bowel or bladder control problems.  You have unusual weakness or numbness in your arms or legs.  You develop nausea or vomiting.  You develop abdominal pain.  You feel faint. Document Released: 08/13/2005 Document Revised: 02/12/2012 Document Reviewed: 12/15/2013 [facility] Patient Information 2015 [place], [name]. This information is not intended to replace advice given to you by your health care provider. Make sure you discuss any questions you have with your health care provider. Costocondritis (Costochondritis) La costocondritis a veces llamada sndrome de Tietze es la hinchazn e irritacin (inflamacin) del tejido Building surveyor) que une las costillas con el (esternn). Esto causa dolor en el pecho y la zona de las [place]. La costocondritis generalmente desaparece por s misma con el tiempo. Podr tardar Elaina Hoops 6 semanas en curarse o ms, especialmente si no puede restringir sus actividades. CAUSAS  Algunos casos de costocondritis no tienen causa conocida. Las causas posibles son:  [place] (traumatismos).  Ejercicios o actividades relacionadas  con [employer].  Tos intensa. SIGNOS Y SNTOMAS  Dolor y sensibilidad en el rea de las [place].  Dolor que empeora al toser o respirar profundamente.  Dolor que empeora con movimientos especficos. DIAGNSTICO  El mdico le preguntar acerca de sus sntomas y le har un examen fsico. Podr indicarle radiografas para descartar otros problemas. TRATAMIENTO  La costocondritis generalmente desaparece por s misma con el tiempo. El mdico podr recetar algunos medicamentos para Primary school teacher. INSTRUCCIONES PARA EL CUIDADO EN EL HOGAR   Evite la actividad fsica extenuante. Trate de no esforzar las [employer] actividades habituales. Aqu se incluyen las [address] en las que [place] los msculos del pecho, abdominales y los msculos laterales, especialmente si debe levantar peso.  Aplique hielo en la zona afectada durante los primeros 2 das despus del inicio del dolor.  Ponga el hielo en una bolsa plstica.  Colquese una [employer]  piel y la bolsa de hielo.  Deje el hielo durante 20 minutos, y aplquelo 2-3 veces por Futures trader.  Tome slo medicamentos de venta libre o recetados, segn las indicaciones del mdico. SOLICITE ATENCIN MDICA SI:  Tiene hinchazn o irritacin en las articulaciones de las costillas. Estos son signos de infeccin.  El dolor no desaparece aunque haga reposo o tome medicamentos para Chief Technology Officer. SOLICITE ATENCIN MDICA DE INMEDIATO SI:   El dolor aumenta o siente muchas molestias.  Le falta el aire o tiene dificultad para respirar.  Tose y escupe sangre.  Siente falta de aire o dolor en el pecho, sudoracin o vmitos.  Tiene fiebre o sntomas persistentes durante ms de 2 - 3 das.  Tiene fiebre y los sntomas empeoran repentinamente. ASEGRESE DE QUE:   Comprende estas instrucciones.  Controlar su afeccin.  Recibir ayuda de inmediato si no mejora o si empeora. Document Released: 05/23/2005 Document Revised:  06/03/2013 [facility] Patient Information 2015 [place], [name]. This information is not intended to replace advice given to you by your health care provider. Make sure you discuss any questions you have with your health care provider.   DIAGNOSIS  Your health care provider will do a physical exam and ask about your symptoms. Chest X-rays or other tests may be done to rule out other problems. TREATMENT  Costochondritis usually goes away on its own over time. Your health care provider may prescribe medicine to help relieve pain. HOME CARE INSTRUCTIONS   Avoid exhausting physical activity. Try not to strain your ribs during normal activity. This would include any activities using chest, abdominal, and side muscles, especially if heavy weights are used.  Apply ice to the affected area for the first 2 days after the pain begins.  Put ice in a plastic bag.  Place a towel between your skin and the bag.  Leave the ice on for 20 minutes, 2-3 times a day.  Only take over-the-counter or prescription medicines as directed by your health care provider. SEEK MEDICAL CARE IF:  You have redness or swelling at the rib joints. These are signs of infection.  Your pain does not go away despite rest or medicine. SEEK IMMEDIATE MEDICAL CARE IF:   Your pain increases or you are very uncomfortable.  You have shortness of breath or difficulty breathing.  You cough up blood.  You have worse chest pains, sweating, or vomiting.  You have a fever or persistent symptoms for more than 2-3 days.  You have a fever and your symptoms suddenly get worse. MAKE SURE YOU:   Understand these instructions.  Will watch your condition.  Will get help right away if you are not doing well or get worse. Document Released: 05/23/2005 Document Revised: 06/03/2013 Document Reviewed: 03/17/2013 [facility] Patient Information 2015 [place], [name]. This information is not intended to replace advice given to you by your  health care provider. Make sure you discuss any questions you have with your health care provider. Reflujo gastroesofgico - Adultos  (Gastroesophageal Reflux Disease, Adult)  El reflujo gastroesofgico ocurre cuando el cido del estmago pasa al esfago. Cuando el cido entra en contacto con el esfago, el cido provoca dolor (inflamacin) en el esfago. Con el tiempo, pueden formarse pequeos agujeros (lceras) en el revestimiento del esfago. CAUSAS   Exceso de Runner, broadcasting/film/video. Esto aplica presin [employer], lo que hace que el cido del estmago suba hacia el esfago.  El hbito de fumar [place] la produccin de cido en el [place].  El  consumo de alcohol. Provoca disminucin de la presin en el esfnter esofgico inferior (vlvula o anillo de msculo entre el esfago y Investment banker, corporate), permitiendo que el cido del estmago suba hacia el esfago.  Cenas a ltima hora del da y estmago lleno. Aumenta la presin y la produccin de cido en el estmago.  Malformacin en el esfnter esofgico inferior. A menudo no se halla causa.  SNTOMAS   Ardor y Radiographer, therapeutic parte inferior del pecho detrs del esternn y en la zona media del [place]. Puede ocurrir [employer] por semana o ms a menudo.  Dificultad para tragar.  Dolor de Advertising copywriter.  Tos seca.  Sntomas similares al asma que incluyen sensacin de opresin en el pecho, falta de aire y sibilancias. DIAGNSTICO  El mdico diagnosticar el problema basndose en los sntomas. En algunos casos, se indican radiografas y otras pruebas para verificar si hay complicaciones o para comprobar el estado del [address] y Training and development officer.  TRATAMIENTO  El mdico le indicar medicamentos de venta libre o recetados para ayudar a disminuir la produccin de cido. Consulte con su mdico antes de Corporate investment banker o agregar cualquier medicamento nuevo.  INSTRUCCIONES PARA EL CUIDADO EN EL HOGAR   Modifique los factores que pueda cambiar. Consulte con su mdico para  solicitar orientacin relacionada con la prdida de peso, dejar de fumar y el consumo de alcohol.  Evite las comidas y bebidas que [address] [place], [place]:  [place] con cafena o alcohlicas.  Chocolate.  Sabores a Advertising account planner.  Ajo y cebolla.  Comidas muy condimentadas.  Ctricos como naranjas, limones o limas.  Alimentos que contengan tomate, como salsas, [place] y pizza.  Alimentos fritos y Lexicographer.  Evite acostarse durante 3 horas antes de irse a dormir o antes de tomar una siesta.  Haga comidas pequeas durante Glass blower/designer de 3 comidas abundantes.  Use ropas sueltas. No use nada apretado alrededor de la cintura que cause presin en el estmago.  Levante (eleve) la cabecera de la cama 6 a 8 pulgadas (15 a 20 cm) con bloques de madera. Usar almohadas extra no ayuda.  Solo tome medicamentos que se pueden comprar sin receta o recetados para el dolor, Dentist o fiebre, como le indica el mdico.  No tome aspirina, ibuprofeno ni antiinflamatorios no esteroides. SOLICITE ATENCIN MDICA DE [employer] SI:   [employer], el cuello, la [place], los dientes o la espalda.  El dolor aumenta o cambia la intensidad o la durancin.  Tiene nuseas, vmitos o sudoracin(diaforesis).  Siente falta de aire o dolor en el pecho, o se desmaya.  Vomita y el vmito tiene [place], es de color [place], [place], negro o es similar a la borra del caf o tiene [place].  Las heces son rojas, sanguinolentas o negras. Estos sntomas pueden ser signos de [address], como enfermedades cardacas, hemorragias gstrias o sangrado esofgico.  ASEGRESE DE QUE:   Comprende estas instrucciones.  Controlar su enfermedad.  Solicitar ayuda de inmediato si no mejora o si empeora. Document Released: 05/23/2005 Document Revised: 11/05/2011 [facility] Patient Information 2015 [place], [name]. This information is not intended to replace advice given to you by your health care provider.  Make sure you discuss any questions you have with your health care provider. Gastroesophageal Reflux Disease, Adult Gastroesophageal reflux disease (GERD) happens when acid from your stomach flows up into the esophagus. When acid comes in contact with the esophagus, the acid causes soreness (inflammation) in the esophagus. Over time, GERD may create  small holes (ulcers) in the lining of the esophagus. CAUSES   Increased body weight. This puts pressure on the stomach, making acid rise from the stomach into the esophagus.  Smoking. This increases acid production in the stomach.  Drinking alcohol. This causes decreased pressure in the lower esophageal sphincter (valve or ring of muscle between the esophagus and stomach), allowing acid from the stomach into the esophagus.  Late evening meals and a full stomach. This increases pressure and acid production in the stomach.  A malformed lower esophageal sphincter. Sometimes, no cause is found. SYMPTOMS   Burning pain in the lower part of the mid-chest behind the breastbone and in the mid-stomach area. This may occur twice a week or more often.  Trouble swallowing.  Sore throat.  Dry cough.  Asthma-like symptoms including chest tightness, shortness of breath, or wheezing. DIAGNOSIS  Your caregiver may be able to diagnose GERD based on your symptoms. In some cases, X-rays and other tests may be done to check for complications or to check the condition of your stomach and esophagus. TREATMENT  Your caregiver may recommend over-the-counter or prescription medicines to help decrease acid production. Ask your caregiver before starting or adding any new medicines.  HOME CARE INSTRUCTIONS   Change the factors that you can control. Ask your caregiver for guidance concerning weight loss, quitting smoking, and alcohol consumption.  Avoid foods and drinks that make your symptoms worse, such as:  Caffeine or alcoholic  drinks.  Chocolate.  Peppermint or mint flavorings.  Garlic and onions.  Spicy foods.  Citrus fruits, such as oranges, lemons, or limes.  Tomato-based foods such as sauce, chili, salsa, and pizza.  Fried and fatty foods.  Avoid lying down for the 3 hours prior to your bedtime or prior to taking a nap.  Eat small, frequent meals instead of large meals.  Wear loose-fitting clothing. Do not wear anything tight around your waist that causes pressure on your stomach.  Raise the head of your bed 6 to 8 inches with wood blocks to help you sleep. Extra pillows will not help.  Only take over-the-counter or prescription medicines for pain, discomfort, or fever as directed by your caregiver.  Do not take aspirin, ibuprofen, or other nonsteroidal anti-inflammatory drugs (NSAIDs). SEEK IMMEDIATE MEDICAL CARE IF:   You have pain in your arms, neck, jaw, teeth, or back.  Your pain increases or changes in intensity or duration.  You develop nausea, vomiting, or sweating (diaphoresis).  You develop shortness of breath, or you faint.  Your vomit is green, yellow, black, or looks like coffee grounds or blood.  Your stool is red, bloody, or black. These symptoms could be signs of other problems, such as heart disease, gastric bleeding, or esophageal bleeding. MAKE SURE YOU:   Understand these instructions.  Will watch your condition.  Will get help right away if you are not doing well or get worse. Document Released: 05/23/2005 Document Revised: 11/05/2011 Document Reviewed: 03/02/2011 [facility] Patient Information 2015 [place], [name]. This information is not intended to replace advice given to you by your health care provider. Make sure you discuss any questions you have with your health care provider.

## 2015-04-19 ENCOUNTER — Encounter: Payer: Self-pay | Admitting: Internal Medicine

## 2015-04-28 ENCOUNTER — Ambulatory Visit (INDEPENDENT_AMBULATORY_CARE_PROVIDER_SITE_OTHER): Payer: Self-pay | Admitting: Urgent Care

## 2015-04-28 ENCOUNTER — Encounter: Payer: Self-pay | Admitting: Urgent Care

## 2015-04-28 VITALS — BP 104/68 | HR 66 | Temp 98.3°F | Resp 16 | Ht 59.0 in | Wt 135.2 lb

## 2015-04-28 DIAGNOSIS — R0789 Other chest pain: Secondary | ICD-10-CM | POA: Insufficient documentation

## 2015-04-28 DIAGNOSIS — R109 Unspecified abdominal pain: Secondary | ICD-10-CM | POA: Insufficient documentation

## 2015-04-28 DIAGNOSIS — R1013 Epigastric pain: Secondary | ICD-10-CM

## 2015-04-28 LAB — POCT UA - MICROSCOPIC ONLY
BACTERIA, U MICROSCOPIC: NEGATIVE
Casts, Ur, LPF, POC: NEGATIVE
Crystals, Ur, HPF, POC: NEGATIVE
Epithelial cells, urine per micros: NEGATIVE
Mucus, UA: NEGATIVE
RBC, URINE, MICROSCOPIC: NEGATIVE
WBC, UR, HPF, POC: NEGATIVE
Yeast, UA: NEGATIVE

## 2015-04-28 LAB — POCT URINALYSIS DIPSTICK
Bilirubin, UA: NEGATIVE
Glucose, UA: NEGATIVE
Ketones, UA: NEGATIVE
LEUKOCYTES UA: NEGATIVE
NITRITE UA: NEGATIVE
PH UA: 7
PROTEIN UA: NEGATIVE
RBC UA: NEGATIVE
Spec Grav, UA: 1.015
UROBILINOGEN UA: 0.2

## 2015-04-28 MED ORDER — ESOMEPRAZOLE MAGNESIUM 20 MG PO PACK: 20.0000 mg | PACK | Freq: Every day | ORAL | Status: DC

## 2015-04-28 NOTE — Progress Notes (Signed)
MRN: [id number] DOB: 02/23/1986  Subjective:   HPI obtained in Spanish.  Karen Gamble is a 29 y.o. female presenting for chief complaint of Follow-up  Reports that she is coming today because she thought she was supposed to have her CMET repeated. She did not understand what she was supposed to do from her previous visit. She was seen here on 04/17/2015, diagnosed with costochondritis and GERD. She was started on Meloxicam, Flexeril and Pepcid for these diagnoses. Today, she reports that her chest pain has improved but still feels like she has knot in her mid chest that radiates up toward her throat, associated with food. Diet consists of [place] food. She previously tried a different medication, not Pepcid AC, she did really well with this but cannot recall the medication name. She also reports right back/flank pain. Has difficulty laying on that side. Also reports intermittent urinary frequency and urgency. Denies fever, n/v, abdominal pain, pelvic pain, dysuria, hematuria, cloudy urine. She is worried that it may be her kidneys. Of note, patient has a pmh of laparoscopic cholecystectomy and states that she had a really bad experience with this. Feels that the providers she saw did not believe her RUQ pain until she actually had cholecystitis. She would like to make sure her kidneys are okay. Patient works in Air cabin crew. Denies daily heavy lifting or history of back injuries. Denies any other aggravating or relieving factors, no other questions or concerns.  Aneesa has a current medication list which includes the following prescription(s): cyclobenzaprine, famotidine, meloxicam, and cephalexin. Also has no allergies on file.  Daphney  has a past medical history of No pertinent past medical history and Gestational diabetes. Also  has past surgical history that includes Cholecystectomy.  Objective:   Vitals: BP 104/68 mmHg  Pulse 66  Temp(Src) 98.3 F (36.8 C) (Oral)   Resp 16  Ht 4\' 11"  (1.499 m)  Wt 135 lb 3.2 oz (61.326 kg)  BMI 27.29 kg/m2  LMP 04/04/2015  Physical Exam  Constitutional: She is oriented to person, place, and time. She appears well-developed and well-nourished.  HENT:  Mouth/Throat: Oropharynx is clear and moist.  Eyes: No scleral icterus.  Cardiovascular: Normal rate, regular rhythm and intact distal pulses.  Exam reveals no gallop and no friction rub.   No murmur heard. Pulmonary/Chest: No respiratory distress. She has no wheezes. She has no rales.  Abdominal: Soft. Bowel sounds are normal. She exhibits no distension and no mass. There is tenderness (lower quadrants/pelvic tenderness).  Musculoskeletal: She exhibits no edema.  Neurological: She is alert and oriented to person, place, and time.  Skin: Skin is warm and dry. No rash noted. No erythema. No pallor.   Results for orders placed or performed in visit on 04/28/15 (from the past 24 hour(s))  POCT urinalysis dipstick     Status: None   Collection Time: 04/28/15  5:17 PM  Result Value Ref Range   Color, UA Light Yellow    Clarity, UA Clear    Glucose, UA Negative    Bilirubin, UA Negative    Ketones, UA Negative    Spec Grav, UA 1.015    Blood, UA Negative    pH, UA 7.0    Protein, UA Negative    Urobilinogen, UA 0.2    Nitrite, UA Negative    Leukocytes, UA Negative Negative  POCT UA - Microscopic Only     Status: None   Collection Time: 04/28/15  5:18 PM  Result Value Ref Range  WBC, Ur, HPF, POC Negative    RBC, urine, microscopic Negative    Bacteria, U Microscopic Negative    Mucus, UA Negative    Epithelial cells, urine per micros Negative    Crystals, Ur, HPF, POC Negative    Casts, Ur, LPF, POC Negative    Yeast, UA Negative    Assessment and Plan :   1. Right flank pain - Unlikely to be infectious which patient is worried about, may be due to adhesions from her previous surgery (cholecystectomy). Labs from today and 04/17/2015 are very  reassuring. Will follow patient closely and may refer for [place] or CT depending on progression of symptoms in the next 4 weeks. Patient agreed.  2. Dyspepsia 3. Atypical chest pain - Likely due to GERD, will start trial of PPI. Advised dietary modifications. Follow-up in 4 weeks.  Wallis Bamberg, PA-C [facility] [phone] 04/28/2015 4:23 PM

## 2015-04-28 NOTE — Patient Instructions (Addendum)
Opciones de alimentos para pacientes con reflujo gastroesofgico (Food Choices for Gastroesophageal Reflux Disease) Cuando se tiene reflujo gastroesofgico (ERGE), los alimentos que se ingieren y los hbitos de alimentacin son muy importantes. Elegir los alimentos adecuados puede ayudar a Paramedic las molestias ocasionadas por el [place]. QU PAUTAS GENERALES DEBO SEGUIR?  Elija las frutas, los vegetales, los cereales integrales, los productos lcteos, la carne de [place], de pescado y de ave con bajo contenido de grasas.  Limite las grasas, [address] [place], los aderezos para [place], la Alpha, los frutos secos y Programme researcher, broadcasting/film/video.  Lleve un registro de las comidas para identificar los alimentos que ocasionan sntomas.  Evite los alimentos que le ocasionen reflujo. Pueden ser distintos para cada persona.  Haga comidas pequeas con frecuencia en lugar de tres comidas [employer].  Coma lentamente, en un clima distendido.  Limite el consumo de alimentos fritos.  Cocine los alimentos utilizando mtodos que no sean la fritura.  Evite el consumo alcohol.  Evite beber grandes cantidades de lquidos con las comidas.  Evite agacharse o recostarse hasta despus de 2 o 3horas de haber comido. QU ALIMENTOS NO SE RECOMIENDAN? Los siguientes son algunos alimentos y bebidas que pueden empeorar los sntomas: Veterinary surgeon. Jugo de tomate. Salsa de tomate y espagueti. Ajes. Cebolla y [place]. Rbano picante. Frutas Naranjas, pomelos y limn (fruta y [place]). Carnes Carnes de [place], de pescado y de ave con gran contenido de grasas. Esto incluye los perros calientes, las [place], el [place], la salchicha, el salame y el tocino. Lcteos Leche entera y [place]. [employer]. Crema. [place]. Helados. Queso crema.  Bebidas Caf y t negro, con o sin cafena Bebidas gaseosas o energizantes. Condimentos Salsa picante. Salsa barbacoa.  Dulces/postres Chocolate y cacao.  [place]. Menta y mentol. Grasas y [employer] con alto contenido de grasas, incluidas las papas fritas. Otros Vinagre. Especias picantes, como la [employer], la pimienta blanca, la pimienta roja, la pimienta de cayena, el curry en [place], los clavos de [place], el jengibre y el [place] en polvo. Los artculos mencionados arriba pueden no ser [employer] de las bebidas y los alimentos que se Theatre stage manager. Comunquese con el nutricionista para recibir ms informacin. Document Released: 05/23/2005 Document Revised: 08/18/2013 [facility] Patient Information 2015 [place], [name]. This information is not intended to replace advice given to you by your health care provider. Make sure you discuss any questions you have with your health care provider.    Dolor en el flanco  (Flank Pain) El dolor en el flanco se refiere a aquel dolor que se localiza en un lado del cuerpo, entre la zona superior del abdomen y Scientific laboratory technician. Puede aparecer en un perodo corto de tiempo (agudo) o puede durar mucho tiempo o ser recurrente (crnico). Puede ser leve o intenso. Puede tener diferentes causas.  CAUSAS  Algunas de las causas ms comunes son:   Esguince muscular.   Espasmos musculares.   Problemas en la columna vertebral (enfermedad de los discos).   Infeccin en el pulmn (neumona).   Lquido en los pulmones (edema pulmonar).   Infeccin renal.   Piedras en el rin.   Una erupcin cutnea muy dolorosa causada por el virus de la varicela (herpes zoster).  Enfermedad de la vescula biliar. INSTRUCCIONES PARA EL CUIDADO EN EL HOGAR  Los cuidados en el hogar dependern de la causa del dolor. En general:   Haga reposo segn las indicaciones del mdico.  Debe ingerir gran cantidad de lquido para  mantener la orina de tono claro o color amarillo plido.  Tome slo medicamentos de venta libre o recetados, segn las indicaciones del mdico. Algunos medicamentos ayudan a Pharmacologist.  Informe al mdico si tiene algn Arboriculturist.  Concurra a las visitas de control, segn las indicaciones. SOLICITE ATENCIN MDICA DE INMEDIATO SI:   El dolor no se alivia con los [employer].   Tiene sntomas nuevos o que empeoran.  El dolor [place].   Siente dolor abdominal.   Le falta el aire.   Tiene nuseas o vmitos persistentes.   El abdomen se hincha.   Sufre mareos o se desmaya.   Observa sangre en la orina.  Tiene fiebre o sntomas persistentes durante ms de 2  3 das.  Tiene fiebre y los sntomas empeoran repentinamente. ASEGRESE DE QUE:   Comprende estas instrucciones.  Controlar su enfermedad.  Solicitar ayuda de inmediato si no mejora o si empeora. Document Released: 11/20/2007 Document Revised: 05/07/2012 [facility] Patient Information 2015 [place], [name]. This information is not intended to replace advice given to you by your health care provider. Make sure you discuss any questions you have with your health care provider.

## 2015-06-02 ENCOUNTER — Ambulatory Visit (INDEPENDENT_AMBULATORY_CARE_PROVIDER_SITE_OTHER): Payer: Self-pay | Admitting: Urgent Care

## 2015-06-02 DIAGNOSIS — Z23 Encounter for immunization: Secondary | ICD-10-CM

## 2015-08-06 IMAGING — CR DG CHEST 2V
2 series · 2 of 2 positions shown · non-contrast
Comparison: Chest x-ray of 08/24/2009

CLINICAL DATA: Chest pain, shortness of breath

EXAM:
CHEST  2 VIEW

[PA]
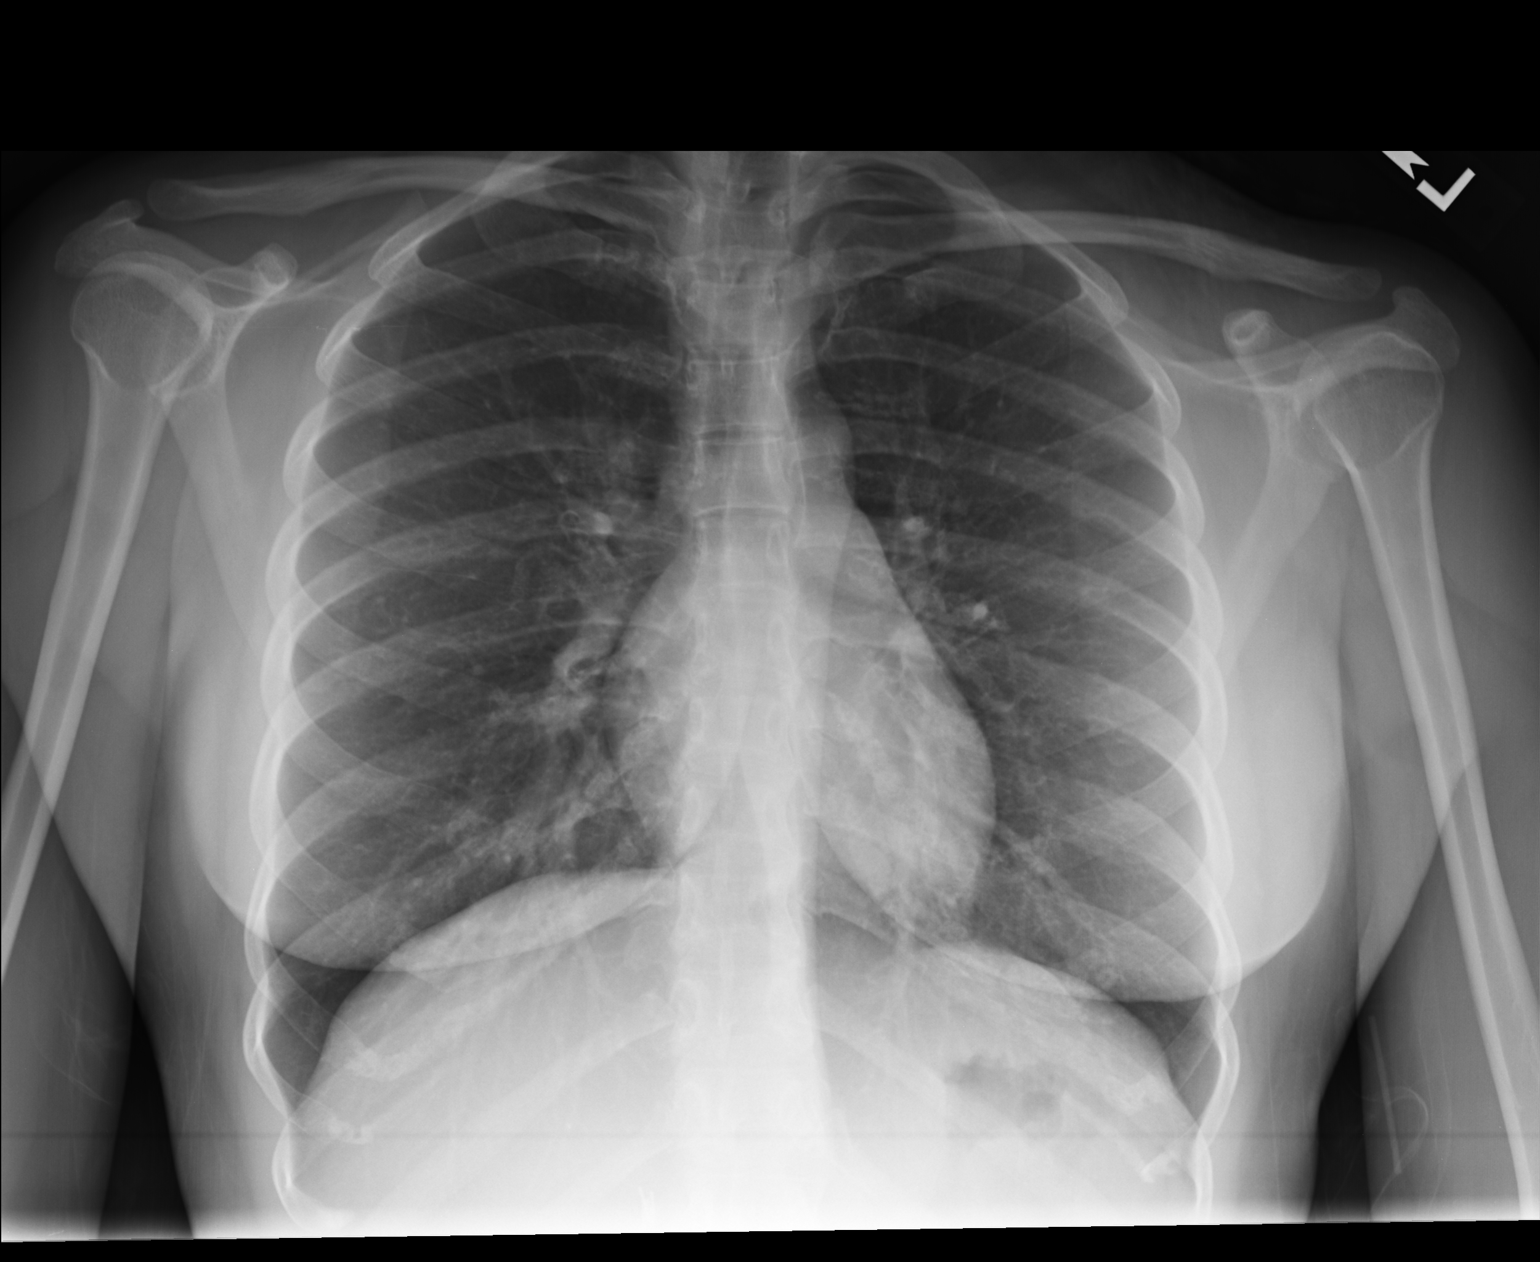

[lateral]
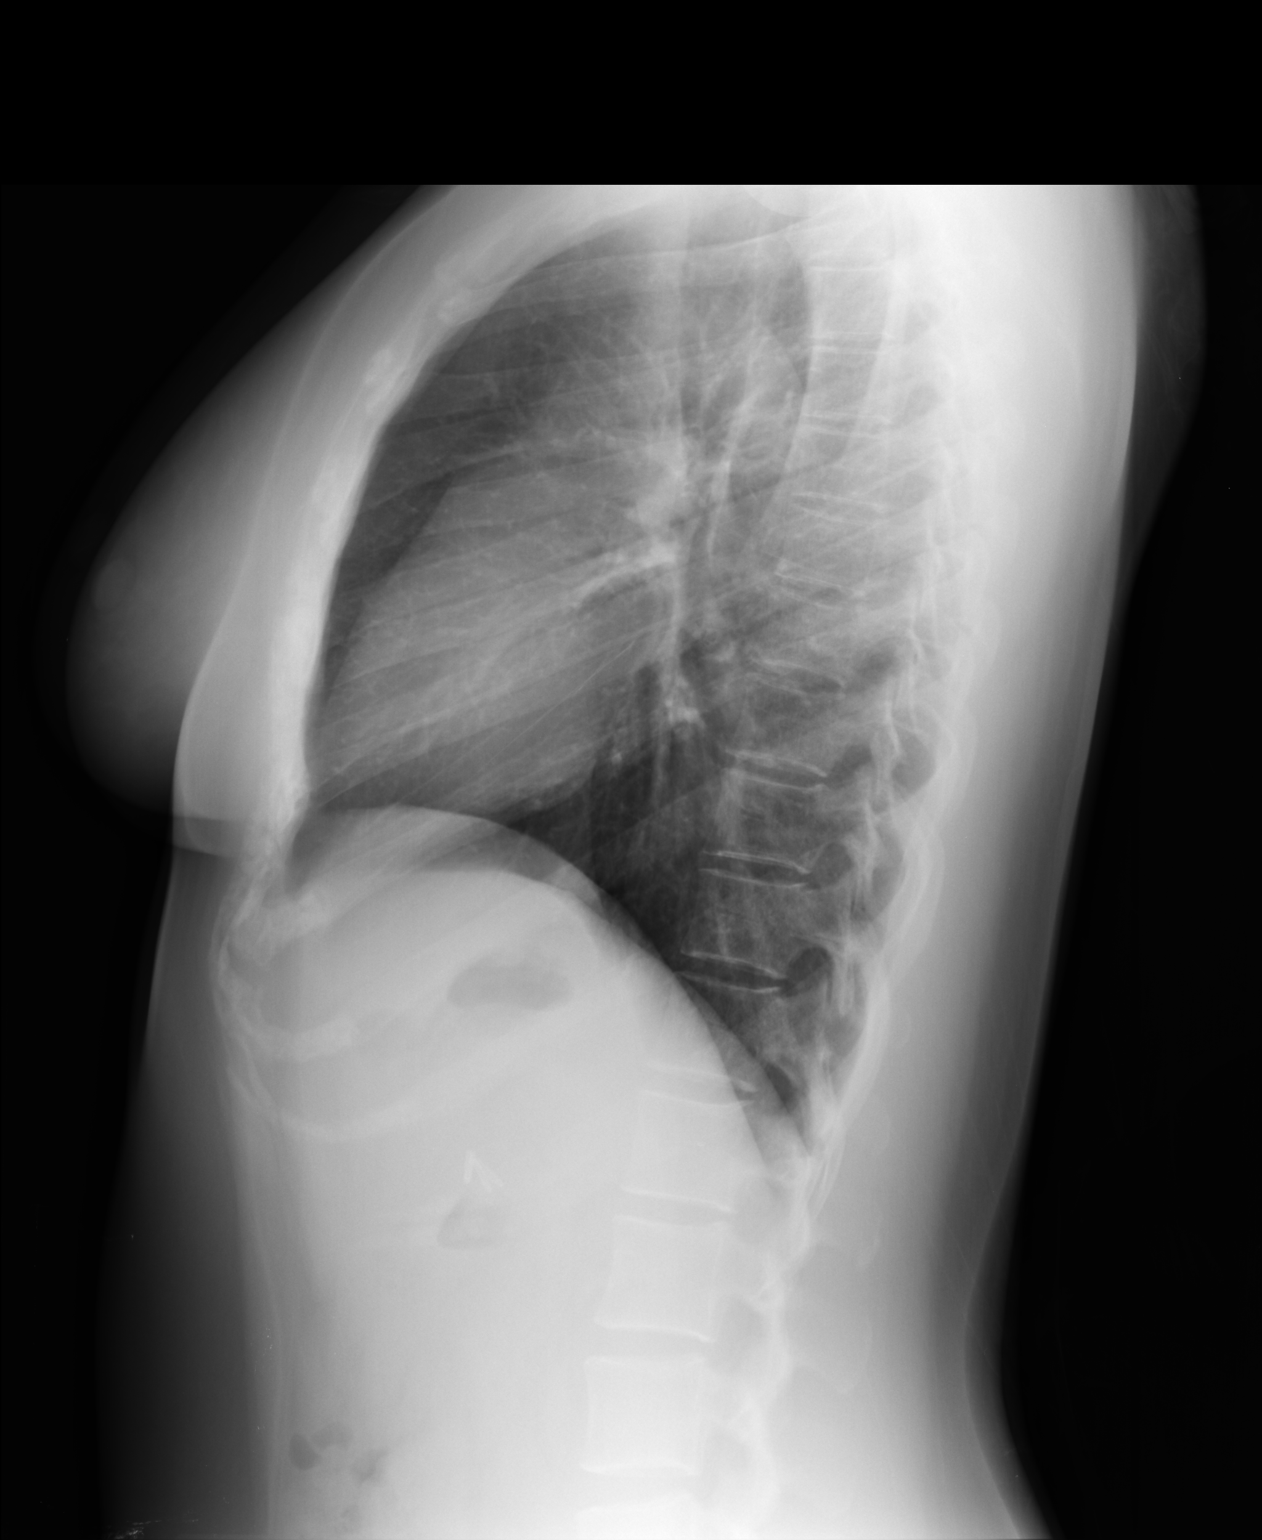

[2 of 2 positions shown; findings below may reference images not displayed]

FINDINGS: No active infiltrate or effusion is seen. Mediastinal and hilar
contours appear normal. The heart is within normal limits in size.
No bony abnormality is seen.
IMPRESSION: No active cardiopulmonary disease.

## 2015-09-10 ENCOUNTER — Ambulatory Visit (INDEPENDENT_AMBULATORY_CARE_PROVIDER_SITE_OTHER): Payer: Self-pay | Admitting: Family Medicine

## 2015-09-10 VITALS — BP 124/70 | HR 71 | Temp 97.9°F | Resp 16 | Ht 60.0 in | Wt 136.8 lb

## 2015-09-10 DIAGNOSIS — N63 Unspecified lump in unspecified breast: Secondary | ICD-10-CM

## 2015-09-10 DIAGNOSIS — J209 Acute bronchitis, unspecified: Secondary | ICD-10-CM

## 2015-09-10 MED ORDER — PREDNISONE 20 MG PO TABS: ORAL_TABLET | ORAL | Status: DC

## 2015-09-10 NOTE — Patient Instructions (Signed)
Si la tos continua, favor de telefonearme.  Vamos hacer una cita para la mammograma el lunes.

## 2015-09-10 NOTE — Progress Notes (Signed)
This chart was scribed for Elvina SidleKurt Ellanore Vanhook, MD by [facility] Hashem, medical scribe [facility].The patient was seen in exam room 14 and the patient's care was started at 11:43 AM.  Patient ID: Karen Gamble MRN: [id number], DOB: [birth year], 30 y.o. Date of Encounter: 09/10/2015  Primary Physician: No PCP Per Patient  Chief Complaint:  Chief Complaint  Patient presents with  . Cough    x 4 months every night is more stronger and sometime bleeding   . Headache  . Breast Mass    left breast   HPI:  Karen Gamble is a 30 y.o. female who [facility] complaining of a cough for which has been gradually worsening for the past 4 months. She begins to sweat after coughing, and may bleed if the coughing spell is severe enough. No fever. She does not work and stays at home. Hx of TB in her family, but skin test was negative.  Past Medical History  Diagnosis Date  . No pertinent past medical history   . Gestational diabetes     2nd pregnancy  . Allergy     Home Meds: Prior to Admission medications   Not on File   Allergies: No Known Allergies  Social History   Social History  . Marital Status: Married    Spouse Name: N/A  . Number of Children: N/A  . Years of Education: N/A   Occupational History  . Not on file.   Social History Main Topics  . Smoking status: Never Smoker   . Smokeless tobacco: Never Used  . Alcohol Use: Yes  . Drug Use: No  . Sexual Activity: Yes    Birth Control/ Protection: Condom   Other Topics Concern  . Not on file   Social History Narrative    Review of Systems: Constitutional: negative for chills, fever, night sweats, weight changes, or fatigue  HEENT: negative for vision changes, hearing loss, congestion, rhinorrhea, ST, epistaxis, or sinus pressure Cardiovascular: negative for chest pain or palpitations Respiratory: negative for hemoptysis, wheezing, shortness of breath.  Positive for cough. Abdominal: negative for abdominal pain, nausea, vomiting, diarrhea, or constipation Dermatological: negative for rash Neurologic: negative for dizziness, or syncope. Positive for headache. All other systems reviewed and are otherwise negative with the exception to those above and in the HPI.  Physical Exam: Blood pressure 124/70, pulse 71, temperature 97.9 F (36.6 C), temperature source Oral, resp. rate 16, height 5' (1.524 m), weight 136 lb 12.8 oz (62.052 kg), last menstrual period 08/25/2015, SpO2 99 %., Body mass index is 26.72 kg/(m^2). General: Well developed, well nourished, in no acute distress. Head: Normocephalic, atraumatic, eyes without discharge, sclera non-icteric, nares are without discharge. Bilateral auditory canals clear, TM's are without perforation, pearly grey and translucent with reflective cone of light bilaterally. Oral cavity moist, posterior pharynx without exudate, erythema, peritonsillar abscess, or post nasal drip.  Neck: Supple. No thyromegaly. Full ROM. No lymphadenopathy. Lungs: Positive for wheezing. Heart: RRR with S1 S2. No murmurs, rubs, or gallops appreciated. Abdomen: Soft, non-tender, non-distended with normoactive bowel sounds. No hepatomegaly. No rebound/guarding. No obvious abdominal masses. Msk:  Strength and tone normal for age. Extremities/Skin: Warm and dry. No clubbing or cyanosis. No edema. No rashes or suspicious lesions. Subcutaneous nodule lateral to the areola of the left breast. Neuro: Alert and oriented X 3. Moves all extremities spontaneously. Gait is normal. CNII-XII grossly in tact. Psych:  Responds to questions appropriately with a  normal affect.   Labs:  ASSESSMENT AND PLAN:  30 y.o. year old female with small superficial left breast mass and persistent cough  By signing my name below, I, Nadim Abuhashem, attest that this documentation has been prepared under the direction and in the presence of Elvina Sidle,  MD.  Electronically Signed: Conchita Paris, medical scribe. 09/10/2015 11:43 AM.    This chart was scribed in my presence and reviewed by me personally.    ICD-9-CM ICD-10-CM   1. Acute bronchitis, unspecified organism 466.0 J20.9 predniSONE (DELTASONE) 20 MG tablet  2. Breast nodule 793.89 N63 [place] Unlisted Procedure Breast     Signed, Elvina Sidle, MD

## 2015-09-21 ENCOUNTER — Other Ambulatory Visit: Payer: Self-pay

## 2015-09-21 DIAGNOSIS — N6320 Unspecified lump in the left breast, unspecified quadrant: Secondary | ICD-10-CM

## 2015-10-17 ENCOUNTER — Telehealth: Payer: Self-pay

## 2015-10-17 DIAGNOSIS — N6320 Unspecified lump in the left breast, unspecified quadrant: Secondary | ICD-10-CM

## 2015-10-17 NOTE — Telephone Encounter (Signed)
Pt's referral order needs to be changed to a diagnostic mammogram and ultrasound

## 2015-10-18 NOTE — Telephone Encounter (Signed)
Is this something you can do or should we send to provider, please see previous message

## 2015-10-24 NOTE — Telephone Encounter (Signed)
A clinical person (on the behalf of the provider) or the provider needs to create a new order for a diagnostic mammogram and ultrasound.

## 2015-10-25 NOTE — Addendum Note (Signed)
Addended by: Cydney Ok on: 10/25/2015 04:22 PM   Modules accepted: Orders

## 2015-10-25 NOTE — Telephone Encounter (Signed)
Still need to be ordered.  Thank you.

## 2015-10-25 NOTE — Telephone Encounter (Signed)
Done

## 2015-11-28 ENCOUNTER — Other Ambulatory Visit (HOSPITAL_COMMUNITY): Payer: Self-pay | Admitting: *Deleted

## 2015-11-28 DIAGNOSIS — N6320 Unspecified lump in the left breast, unspecified quadrant: Secondary | ICD-10-CM

## 2015-12-08 ENCOUNTER — Ambulatory Visit
Admission: RE | Admit: 2015-12-08 | Discharge: 2015-12-08 | Disposition: A | Discharge status: Home / Self Care | Payer: No Typology Code available for payment source | Source: Ambulatory Visit | Attending: Obstetrics and Gynecology | Admitting: Obstetrics and Gynecology

## 2015-12-08 ENCOUNTER — Encounter (HOSPITAL_COMMUNITY): Payer: Self-pay

## 2015-12-08 ENCOUNTER — Ambulatory Visit (HOSPITAL_COMMUNITY)
Admission: RE | Admit: 2015-12-08 | Discharge: 2015-12-08 | Disposition: A | Discharge status: Home / Self Care | Payer: Self-pay | Source: Ambulatory Visit | Attending: Obstetrics and Gynecology | Admitting: Obstetrics and Gynecology

## 2015-12-08 VITALS — BP 104/60 | Temp 97.8°F | Ht 60.0 in | Wt 142.4 lb

## 2015-12-08 DIAGNOSIS — N6325 Unspecified lump in the left breast, overlapping quadrants: Secondary | ICD-10-CM

## 2015-12-08 DIAGNOSIS — Z1239 Encounter for other screening for malignant neoplasm of breast: Secondary | ICD-10-CM

## 2015-12-08 DIAGNOSIS — N6320 Unspecified lump in the left breast, unspecified quadrant: Secondary | ICD-10-CM

## 2015-12-08 HISTORY — DX: Other seasonal allergic rhinitis: J30.2

## 2015-12-08 NOTE — Patient Instructions (Addendum)
Educational materials on self breast awareness given. Explained to [facility] Gamble that she did not need a Pap smear today due to last Pap smear was November 18, 2015 per patient. Let her know B[facility]CP will cover Pap smears every 3 years unless has a history of abnormal Pap smears. [facility]. Appointment scheduled for Thursday, December 08, 2015 at 1030. Karen Gamble verbalized understanding.  [name], Kathaleen Maserhristine Poll, RN 11:24 AM

## 2015-12-08 NOTE — Progress Notes (Signed)
[name] [name] Patient rates pain at a 7-8 out of 10.  Pap Smear:  Pap smear not completed today. [facility] and normal per patient. Per patient has no history of an abnormal Pap smear. No Pap smear results in EPIC.  Physical exam: Breasts Breasts symmetrical. No skin abnormalities bilateral breasts. No nipple retraction bilateral breasts. No nipple discharge bilateral breasts. No lymphadenopathy. No lumps palpated right breast. Palpated a pea sized lump within the left breast at 3 o'clock 4 cm from the nipple. Complaints of left outer breast tenderness on exam. [name] Appointment scheduled for Thursday, December 08, 2015 at 1030.   Pelvic/Bimanual No Pap smear completed today since last Pap smear was 11/18/2015. Pap smear not indicated per BCCCP guidelines.   Smoking History: Patient has never smoked.  Patient Navigation: Patient education provided. [facility] to services [facility] for [facility] through [facility]. Spanish interpreter provided.  [name]

## 2015-12-12 ENCOUNTER — Encounter (HOSPITAL_COMMUNITY): Payer: Self-pay | Admitting: *Deleted

## 2016-03-29 ENCOUNTER — Ambulatory Visit (INDEPENDENT_AMBULATORY_CARE_PROVIDER_SITE_OTHER): Payer: Self-pay | Admitting: Family Medicine

## 2016-03-29 ENCOUNTER — Ambulatory Visit (INDEPENDENT_AMBULATORY_CARE_PROVIDER_SITE_OTHER): Payer: Self-pay

## 2016-03-29 VITALS — BP 116/72 | HR 68 | Temp 98.3°F | Ht 59.5 in | Wt 138.4 lb

## 2016-03-29 DIAGNOSIS — M545 Low back pain: Secondary | ICD-10-CM

## 2016-03-29 DIAGNOSIS — R1030 Lower abdominal pain, unspecified: Secondary | ICD-10-CM

## 2016-03-29 LAB — POCT URINALYSIS DIP (MANUAL ENTRY)
BILIRUBIN UA: NEGATIVE
Bilirubin, UA: NEGATIVE
Blood, UA: NEGATIVE
GLUCOSE UA: NEGATIVE
Nitrite, UA: NEGATIVE
Protein Ur, POC: NEGATIVE
SPEC GRAV UA: 1.01
Urobilinogen, UA: 0.2
pH, UA: 5.5

## 2016-03-29 LAB — POC MICROSCOPIC URINALYSIS (UMFC): MUCUS RE: ABSENT

## 2016-03-29 LAB — POCT URINE PREGNANCY: PREG TEST UR: NEGATIVE

## 2016-03-29 MED ORDER — CYCLOBENZAPRINE HCL 5 MG PO TABS: ORAL_TABLET | ORAL | 0 refills | Status: DC

## 2016-03-29 MED ORDER — NITROFURANTOIN MONOHYD MACRO 100 MG PO CAPS: 100.0000 mg | ORAL_CAPSULE | Freq: Two times a day (BID) | ORAL | 0 refills | Status: DC

## 2016-03-29 MED ORDER — MELOXICAM 7.5 MG PO TABS: 7.5000 mg | ORAL_TABLET | Freq: Every day | ORAL | 0 refills | Status: DC

## 2016-03-29 NOTE — Patient Instructions (Addendum)
macrobid por infeccion posible, pero voy chequiar otro prueba por infeccion. creo su dolor es en sus musculos de espalda. Tome mobic cada dia, cyclobenzaprine en la noche si necesario, ejercisios, y si no esta mejor en 2 semanas regrese. Mas temprano o cuarto de emergencia si empeorse.   Esguince de la cintura con rehabilitacin (Low Back Sprain With Rehab) Un esguince es una lesin en la que el ligamento se desgarra. Los ligamentos de la cintura son susceptibles de sufrir esguinces. Sin embargo, estos ligamentos son Lynnae Sandhoff fuertes y se requiere de una gran fuerza para lesionarlos. Son importantes para estabilizar la mdula espinal. Los esguinces se clasifican en tres categoras. Los esguinces de grado 1 ocasionan dolor, pero el tendn no est alargado. En los esguinces de grado 2 hay un ligamento alargado, debido a un estiramiento o desgarro parcial. En el esguince de [place] 2 an se mantiene la funcin, aunque sta puede estar alterada. Un esguince en grado 3 es la ruptura completa del msculo o el tendn, y suele quedar incapacitada la funcin. SNTOMAS  Dolor intenso en la cintura.  Sensacin de estallido o ruptura en el momento de la lesin.  Sensibilidad y a veces hinchazn en la zona de la lesin.  Algunas veces, hematoma (contusin) en el lugar de la lesin dentro de las 48 horas.  Espasmos musculares en la espalda. CAUSAS El esguince se produce cuando se aplica una fuerza en el ligamento que es mayor de lo que puede soportar. Las causas ms frecuentes de la lesin son:  Garnetta Buddy actividad estresante en una posicin incmoda.  Actividades estresantes repetidas que implican movimiento de la cintura.  Golpe directo en la cintura (traumatismo). LOS RIESGOS AUMENTAN CON:  Deportes de contacto (ftbol, lucha).  Colisiones (principalmente accidentes de esqu).  Deportes que requieren arrojar o Cabin crew elemento (levantamiento de pesas, bisbol).  Deportes que implican girar la  columna (gimnasia, clavados, tenis, golf)  Poca fuerza y flexibilidad.  Proteccin inadecuada.  Cirugas previas en la espalda (especialmente fusin). PREVENCIN  Use el equipo protector adecuado y [employer].  Precalentamiento adecuado y elongacin antes de la [place].  Descanso y recuperacin entre actividades.  Mantener la forma fsica:  Earma [place], flexibilidad y resistencia muscular.  Capacidad cardiovascular.  Mantenga un peso corporal adecuado. PRONSTICO Si se trata adecuadamente, estos esguinces pueden curarse con tratamiento no quirrgico. El tiempo de curacin depende de la gravedad de la lesin.  posibles complicaciones:  La recurrencia frecuente de los sntomas puede dar como resultado un problema crnico.  Inflamacin crnica y dolor en la cintura.  Retraso en la curacin o resolucin de los sntomas, en particular si se retoma la actividad rpidamente.  Discapacidad prolongada.  Articulacin inestable o artrtica en la cintura. TRATAMIENTO El tratamiento inicial incluye el uso de medicamentos y la aplicacin de hielo para reducir Chief Technology Officer y la inflamacin. Los ejercicios de elongacin y fortalecimiento pueden ayudar a reducir Chief Technology Officer con la [place]. Los ejercicios pueden Management consultant o con un terapeuta. Los [place] Claiborne graves pueden requerir la derivacin a un fisioterapeuta para Magazine features editor evaluacin y Games developer un tratamiento, como ultrasonido. El profesional podr indicarle el uso de un dispositivo ortopdico para ayudar a Glass blower/designer y la inflamacin. A menudo, demasiado reposo en cama podr resultar en ms daos que beneficios. Podrn prescribirle inyecciones de corticoides. Sin embargo, esto deber reservarse para los casos ms graves. Es Therapist, occupational uso de la espalda cuando se levantan objetos. Por la noche, se aconseja  que usted [place], sobre un colchn firme y coloque una almohada debajo de las rodillas. Si no se  obtiene xito con Artist, ser necesario someterse a [place].  MEDICAMENTOS   Si es necesaria la administracin de medicamentos para Chief Technology Officer, se recomiendan los antiinflamatorios no esteroides, como aspirina e ibuprofeno y otros calmantes menores, como acetaminofeno.  No tome medicamentos para el dolor dentro de los [address] previos a la [place].  El profesional podr prescribirle calmantes si lo considera necesario. Utilcelos como se le indique y slo cuando lo necesite.  Podr beneficiarse con Teachers Insurance and Annuity Association.  En algunos casos se indica una inyeccin de corticosteroides. Estas inyecciones deben reservarse para los [place] graves, porque slo se pueden administrar una determinada cantidad de veces. CALOR Y FRO   El fro (con hielo) debe aplicarse durante 10 a 15 minutos cada 2  3 horas para reducir la inflamacin y Chief Technology Officer e inmediatamente despus de cualquier actividad que agrava los sntomas. Utilice bolsas o un masaje de hielo.  El calor puede usarse antes de Therapist, music y de las actividades de fortalecimiento indicadas por el profesional, le fisioterapeuta o Orthoptist. Utilice una bolsa trmica o un pao hmedo. SOLICITE ATENCIN MDICA SI:   Los sntomas empeoran o no mejoran en 2 a 4 semanas, an realizando Pharmacist, community.  Presenta adormecimiento o debilidad en alguna de las piernas.  Prdida del control del intestino o de la vejiga.  Luego de la ciruga observa lo siguiente: fiebre, dolor intenso, hinchazn, enrojecimiento, drena lquido o sangra en la regin de la herida.  Desarrolla nuevos e inexplicables sntomas. (Los [employer] utilizados en el tratamiento le ocasionan efectos secundarios). EJERCICIOS  EJERCICIOS DE AMPLITUD DE MOVIMIENTOS Cherlynn June - Esguince de cintura La mayora de las personas con dolor de espalda baja encuentran que sus sntomas empeoran al doblarse hacia adelante (flexin) o al arquear la regin inferior de la  espalda (extensin). Los ejercicios que le ayudarn a Oncologist sus sntomas se Research scientist (life sciences).  El mdico, fisioterapeuta o Research scientist (physical sciences) ayudarn a Chief Strategy Officer qu ejercicios sern de ayuda para resolver su dolor de espalda. No realice ningn ejercicio sin consultarlo antes con el profesional. Discontine los ejercicios que empeoran sus sntomas, hasta que hable con el mdico. Si siente dolor, entumecimiento u hormigueo que [employer] glteos, piernas o pies, el objetivo de esta terapia es que estos sntomas se acerquen a la espalda y Customer service manager. A veces, estos sntomas en las piernas mejoran, pero el dolor de espalda empeora. Este suele ser un indicio de progreso en su rehabilitacin. Asegrese de que estar atento a cualquier cambio en sus sntomas y las actividades que ha [employer]	[place] 24 horas antes del cambio. Compartir esta informacin con su mdico le permitir un mejor tratamiento para tratar su enfermedad. Estos ejercicios le ayudarn en la recuperacin de la lesin. Los sntomas podrn aliviarse con o sin asistencia adicional de su mdico, fisioterapeuta o Herbalist. Al completar estos ejercicios, recuerde:   Restaurar la flexibilidad del tejido ayuda a que las articulaciones recuperen el movimiento normal. Esto permite que el movimiento y la actividad sea ms saludables y menos dolorosos.  Para que sea efectiva, cada elongacin debe realizarse durante al menos 30 segundos.  La elongacin nunca debe ser dolorosa. Deber sentir slo un alargamiento o distensin suave del tejido que estira. EJERCICIOS DE AMPLITUD DE MOVIMIENTOS Y ELONGACIN: ELONGACION Flexin - una rodilla al pecho  Recustese en una cama  dura o [employer], con ambas piernas extendidas al frente.  Manteniendo una pierna en contacto con el piso, lleve la rodilla opuesta al pecho. Mantenga la pierna en esa posicin, sostenindola por la zona posterior del muslo o por la  rodilla.  Presione hasta sentir un suave estiramiento en la cintura. Mantenga esta posicin durante __________ segundos.  Libere la pierna lentamente y repita el ejercicio con el lado opuesto. Reptalo __________ veces. Realice este ejercicio __________ veces por da.  ELONGACIN - Flexin, dos rodillas al pecho   Recustese en una cama dura o sobre el piso, con ambas piernas extendidas al frente.  Manteniendo una pierna en contacto con el piso, lleve la rodilla opuesta al pecho.  Tense los msculos del estmago para apoyar la espalda y levante la otra rodilla [place]. Mantenga las piernas en su lugar y tmese por detrs de las caderas o las rodillas.  Con ambas rodillas en el pecho, tire hasta que sienta un estiramiento en la parte trasera de la espalda. Mantenga esta posicin durante __________ segundos.  Tense los msculos del estmago y baje las piernas de a una por vez. Reptalo __________ veces. Realice este ejercicio __________ veces por da.  ELONGACIN - Rotacin de la zona baja del tronco  Recustese sobre una cama firme o sobre el suelo. Mantenga las piernas al frente, doble las rodillas de modo que ambas apunten hacia el techo y los pies queden bien apoyados en el piso.  Extienda los brazos a Dance movement psychotherapist. Esto estabilizar la zona superior del cuerpo, manteniendo los hombros en contacto con el piso.  Con cuidado y lentamente deje caer ambas rodillas juntas hacia un lado, hasta que sienta un suave estiramiento en la espalda baja. Mantenga esta posicin durante __________ segundos.  Tensione los [employer] Corporation del estmago para Occupational psychologist la cintura mientras lleva las rodillas nuevamente a la posicin inicial. Repita el ejercicio hacia el otro lado. Reptalo __________ veces. Realice este ejercicio __________ veces por da. EJERCICIOS DE AMPLITUD DE MOVIMIENTOS Y FLEXIBILIDAD: ELONGACIN - Extensin posicin prona sobre los codos  Acustese sobre el estmago sobre el piso, una  cama ser muy blanda. Coloque las palmas a una distancia igual al ancho de los hombres y a la altura de la cabeza.  Coloque los codos bajo los hombros. Si siente dolor, colquese almohadas debajo del pecho.  Deje que su cuerpo se relaje, de modo que las caderas queden ms abajo y tengan ms contacto con el piso.  Mantenga esta posicin durante __________ segundos.  Vuelva lentamente a la posicin plana sobre el piso. Reptalo __________ veces. Realice este ejercicio __________ veces por da.  AMPLITUD DE MOVIMIENTOS - Extensin - flexin de brazos en posicin prona  Acustese sobre el [employer]	[place] piso, una cama ser [place]. Coloque las palmas a una distancia igual al ancho de los hombres y a la altura de la cabeza.  Mantenga la espalda tan relajada como pueda, enderece lentamente los codos [address] las caderas contra el suelo. Puede modificar la posicin de las manos para estar ms cmodo. A medida que gana movimiento, sus manos quedarn ms por debajo de los hombros.  Mantenga cada posicin durante __________ segundos.  Vuelva lentamente a la posicin plana sobre el piso. Reptalo __________ veces. Realice este ejercicio __________ veces por da.  AMPLITUD DE MOVIMIENTOS - Cuadrpedo [place] vertebral neutral  Coloque las manos y las rodillas en una superficie firme. Las manos deben quedar a la altura de los hombros y las  rodillas debajo de las caderas. Puede colocar algo debajo las rodillas para estar ms cmodo.  Haga caer la cabeza y apunte el cccix hacia el suelo debajo de usted. De este modo se redondear la cintura, en [place] similar a un gato enojado. Mantenga esta posicin durante __________ segundos.  Lentamente levante la cabeza y afloje el cccix para que se hunda el cuerpo en un gran arco, como un caballo.  Mantenga esta posicin durante __________ segundos.  Reptalo hasta sentir calor en la cintura.  Ahora encuentre su "punto ideal". Ser la  posicin ms cmoda [employer]. En esta posicin es cuando su columna est neutral. Una vez que encuentre la posicin, tensione los msculos del estmago para sostener la zona inferior de la espalda.  Mantenga esta posicin durante __________ segundos. Reptalo __________ veces. Realice este ejercicio __________ veces por da.  EJERCICIOS DE FORTALECIMIENTO - Esguince de la cintura Estos ejercicios le ayudarn en la recuperacin de la lesin. Estos ejercicios deben hacerse cerca de su "punto dulce". Este es el arco neutro, de la parte baja de la espalda, en algn lugar entre la posicin completamente redondeada y arqueada plenamente, que es la posicin menos dolorosa. Cuando se realiza en [employer] de seguridad del movimiento, estos ejercicios se pueden Chemical engineer para las personas que tienen una lesin basada en flexin o extensin. Con estos ejercicios, los sntomas podrn desaparecer con o sin mayor intervencin del profesional, el fisioterapeuta o Orthoptist. Al completar estos ejercicios, recuerde:   Los msculos pueden ganar la resistencia y la fuerza necesarias para las actividades diarias a travs de ejercicios controlados.  Realice los ejercicios como se lo indic el mdico, el fisioterapeuta o Orthoptist. Aumente la resistencia y las repeticiones segn se le haya indicado.  Podr experimentar dolor o cansancio muscular, pero el dolor o molestia que trata de eliminar a travs de los ejercicios nunca debe empeorar. Si el dolor empeora, detngase y asegrese de que est siguiendo las directivas correctamente. Si an siente dolor luego de Education officer, environmental lo ajustes necesarios, deber discontinuar el ejercicio hasta que pueda conversar con el profesional sobre el problema. FORTALECIMIENTO - Abdominales profundos - Inclinacin plvica  Recustese sobre una cama firme o sobre el suelo. Mantenga las piernas al frente, doble las rodillas de modo que ambas apunten hacia el techo  y los pies queden bien apoyados en el piso.  Tensione la zona baja de los msculos abdominales para presionar la Oncologist. Este movimiento har rotar su pelvis de modo que el cccix quede hacia arriba y no apuntando a los pies o hacia el piso. Con una tensin suave y respiracin pareja, mantenga esta posicin durante __________ segundos. Reptalo __________ veces. Realice este ejercicio __________ veces por da.  FORTALECIMIENTO - Abdominales encogimiento abdominal.  Recustese sobre una cama firme o sobre el suelo. Mantenga las piernas al frente, doble las rodillas de modo que ambas apunten hacia el techo y los pies queden bien apoyados en el piso. Cruce las [employer].  Apunte suavemente con la barbilla hacia abajo, sin doblar el cuello.  Tensione los abdominales y eleve lentamente el tronco la altura suficiente para despegar los omplatos. Si se eleva ms, pondr tensin excesiva en la cintura y esto no fortalecer ms los abdominales.  Controle la vuelta a la posicin inicial. Reptalo __________ veces. Realice este ejercicio __________ veces por da.  EN CUATRO MIEMBROS - Cuadrpedo, elevacin de miembro superior e inferior opuestos   [employer]  manos y las rodillas en una superficie firme. Las manos deben quedar a la altura de los hombros y las rodillas debajo de las caderas. Puede colocar algo debajo las rodillas para estar ms cmodo.  Encuentre la posicin neutral de la columna vertebral y Public house manager los msculos abdominales de modo que pueda mantener esta posicin. Los hombros y las caderas deben formar un rectngulo paralelo con el suelo y recto.  Manteniendo el tronco firme, eleve la mano derecha a la altura del hombro y luego eleve la pierna izquierda a la altura de la cadera. Asegrese de no contener la respiracin. Mantenga esta posicin durante __________ segundos.  Con los msculos abdominales en tensin y la espalda firme, vuelva lentamente  a la posicin inicial. Repita con el otro brazo y la otra pierna.  Reptalo __________ veces. Realice este ejercicio __________ veces por da. FUERZA - Abdominales y cudriceps - Levantar las piernas rectas  Recustese en una cama dura o sobre el piso, con ambas piernas extendidas al frente.  Deje una pierna en contacto con el suelo y doble la otra rodilla de manera que el pie quede contra el suelo.  Encuentre la posicin neutral de la columna vertebral y Public house manager los msculos abdominales de modo que pueda mantener esta posicin.  Levante lentamente la pierna del suelo una 6 pulgadas y cuente [place] 15, asegrese de no contener la respiracin.  Mantega la columna en posicin neutral, y baje lentamente la pierna hasta el suelo. Repita el ejercicio con cada pierna __________ veces. Realice este ejercicio __________ veces por da. CONSIDERACIONES ACERCA DE LA POSTURA Y LA MECNICA DEL CUERPO  Esguince de la cintura Si mantiene una postura correcta cuando se encuentre de pie, sentado o realizando sus actividades, reducir el estrs Teachers Insurance and Annuity Association diferentes tejidos del cuerpo, y Acupuncturist a los tejidos lesionados la posibilidad de curarse y Film/video editor las experiencias dolorosas. A continuacin se indican pautas generales para mejorar la postura. Su mdico o fisioterapeuta le dar instrucciones especficas segn sus necesidades. Al leer estas pautas recuerde:  Los ejercicios indicados por su mdico lo ayudarn a Chartered certified accountant flexibilidad y la fuerza para Pharmacologist las posturas correctas.  La postura correcta proporciona el mejor entorno de trabajo para las articulaciones. Las articulaciones se desgastan menos cuando estn sostenidas adecuadamente por una columna vertebral en buena postura. Esto significa que su cuerpo estar ms sano y Research officer, trade union.  La correcta postura debe practicarse en todas las actividades, especialmente al estar sentado o de pie durante [place]. Tambin es importante al  realizar actividades repetitivas de bajo estrs (tipeo) o una actividad nica y pesada. POSICIONES DE Dellie Catholic Tenga en cuenta cules son las posturas que ms dolor le causan al elegir una posicin de descanso. Si siente dolor con las actividades en que deba realizar una flexin (sentarse, inclinarse, detenerse, ponerse en cuclillas), elija una posicin que le permita descansar en una postura menos flexionada. Evite curvarse en posicin fetal cuando se encuentre de lado. Si el dolor empeora con las actividades basadas en la extensin (estar de pie durante un tiempo prolongado, trabajar con las manos por arriba de la cabeza) evite descansar en Neomia Dear posicin extendida durante mucho tiempo, como dormir sobre el [place]. La [employer] de las Psychologist, forensic cmodo el descanso sobre la columna vertebral en una posicin neutral, ni muy redondeada ni [place]. Recustese sobre su lado en una cama que no est hundida con una almohada entre las rodillas o sobre la espalda con una almohada bajo las rodillas,  y sentir alivio. Tenga en cuenta que cualquier posicin en [employer], no importa si es una postura [place], puede provocarle rigidez. POSTURAS CORRECTAS PARA SENTARSE Con el fin de minimizar el estrs y Environmental health practitioner en su columna, deber sentarse con la postura correcta. Sentarse con una buena postura debe ser algo sin esfuerzo para un cuerpo sano. Recuperar una buena postura es un proceso gradual. Muchas personas pueden trabajar ms cmodas mediante el uso de diferentes soportes hasta que tengan la flexibilidad y la fuerza para mantener esta postura por su cuenta. Al sentarse con la [employer], los odos deben estar sobre los hombros y los hombros sobre las caderas. Debe utilizar el respaldo de la silla para apoyar la espalda. La espalda estar en una posicin neutral, ligeramente arqueada. Puede colocar una pequea almohada o toalla doblada en la base de la espalda baja para apoyo.  Si  trabaja en un escritorio, cree un ambiente que le proporciones un buen soporte y [place]. Sin apoyo adicional, msculos se cansan, lo que lleva a una tensin excesiva en las articulaciones y otros tejidos. Tenga en cuenta estas recomendaciones: SILLA:   La silla debe poder deslizarse por debajo del escritorio cuando su espalda tome contacto con el respaldo. Esto le permitir trabajar ms cerca.  La altura de la silla debe permitirle que los ojos tengan el nivel de la parte superior del monitor y las manos estn ms abajo que los codos. POSICIN DEL CUERPO  Los pies deben tener contacto con el piso. Si no es posible, use un posapies.  Mantenga las [employer] hombros. Esto reducir el estrs en el cuello y en la cintura. POSTURAS INCORRECTAS PARA SENTARSE Si se siente cansado e incapaz de asumir una postura sentada sana, no se eche hacia atrs. Esto pone una tensin excesiva en los tejidos de su espalda, y causa ms dao y Engineer, mining. Entre las opciones ms saludables se incluyen:  El uso de ms apoyo, como una almohada lumbar.  Cambio de tareas, a algo que demande una posicin vertical o caminar.  Tomar una breve caminata.  Recostarse y Theatre stage manager posicin neutral. DE PIE DURANTE UN TIEMPO PROLONGADO E INCLINADO LIGERAMENTE HACIA ADELANTE Cuando deba realizar una tarea que requiera inclinacin hacia adelante estando de pie en el mismo sitio durante mucho tiempo, coloque un pie en un objeto de 2 a 4 pulgadas de alto, para [employer]. Cuando ambos pies estn en el piso, la zona inferior de la espalda tiene a perder su ligera curvatura hacia adentro. Si esta curva se aplana (o se pronuncia demasiado) la espalda y las articulaciones experimentarn demasiado estrs, se fatigarn ms rpidamente y Geophysicist/field seismologist.  POSTURAS CORRECTAS PARA ESTAR DE PIE Una postura adecuada de pie realizarse en todas las actividades diarias, incluso si slo toman un momento, como al  [employer]. Como en la postura de sentado, los odos deben estar sobre los hombros y los hombros sobre las caderas. Deber mantener una ligera tensin en sus msculos abdominales para asegurar la columna vertebral. El cccix debe apuntar hacia el suelo, no detrs de su cuerpo, ya que resultara en una curvatura de la espalda sobre-extendida.  POSTURAS INCORRECTAS PARA ESTAR DE PIE Las posturas incorrectas para estar de pie incluyen tener la cabeza hacia delante, las rodillas bloqueadas o una excesiva curvatura de la espalda. CAMINAR [place] en Deborha Payment erguida. [place], hombros y caderas deben estar alineados. ACTIVIDAD PROLONGADA EN UNA POSICIN FLEXIONADA Al completar una tarea  que requiere que se doble la cintura hacia adelante o inclinarse sobre una superficie baja, trate de encontrar una manera de estabilizar 3 de cada 4 de sus miembros. Puede colocar una mano o el codo en el [place], o descansar una rodilla en la superficie en la que est apoyado. Esto le proporcionar ms estabilidad para que sus msculos no se cansen tan rpidamente. El [employer] rodillas [place], o ligeramente dobladas, tambin reducir el estrs en la espalda baja. TCNICAS CORRECTAS PARA LEVANTAR OBJETOS SI:   Asumir una postura amplia. Esto le proporcionar ms estabilidad y la oportunidad de acercarse lo ms posible al objeto que se est levantando.  Tense los abdominales para asegurar la columna vertebral. Doble las rodillas y las caderas. Manteniendo la espalda en una posicin neutral, haga el esfuerzo con los msculos de la pierna. Levntese con las piernas, manteniendo la espalda derecha.  Pruebe el peso de los objetos desconocidos antes de tratar de Secondary school teacher.  Trate de [employer] codos hacia abajo y a los lados, con el fin de obtener la fuerza de los hombros al llevar un objeto.  Siempre pida ayuda a otra persona cuando deba levantar objetos pesados o incmodos. TCNICAS INCORRECTAS  PARA LEVANTAR OBJETOS NO:   Bloquee rodillas al levantar, aunque sea un objeto pequeo.  Se doble ni gire. Gire sobre los pies ni los mueva cuando necesite cambiar de direccin.  Considere que no puede levantar incluso un clip de papel con seguridad, sin Clinical biochemist.   Esta informacin no tiene Theme park manager el consejo del mdico. Asegrese de hacerle al mdico cualquier pregunta que tenga.   Document Released: 05/30/2006 Document Revised: 12/28/2014 Elsevier Interactive Patient Education [employer]    IF you received an x-ray today, you will receive an invoice from [facility]. Please contact [facility] at [phone] with questions or concerns regarding your invoice.   IF you received labwork today, you will receive an invoice from [facility] [place]. Please contact [facility] at [phone] with questions or concerns regarding your invoice.   Our billing staff will not be able to assist you with questions regarding bills from these companies.  You will be contacted with the lab results as soon as they are available. The fastest way to get your results is to activate your My Chart account. Instructions are located on the last page of this paperwork. If you have not heard from [place] regarding the results in 2 weeks, please contact this [email or web address]

## 2016-03-29 NOTE — Progress Notes (Signed)
By signing my name below, I, Mesha Guinyard, attest that this documentation has been prepared under the direction and in the presence of Meredith Staggers, MD.  Electronically Signed: Arvilla Market, Medical Scribe. 03/29/16. 12:50 PM.  Subjective:    Patient ID: Karen Gamble, female    DOB: 10/16/1985, 30 y.o.   MRN: [id number]  HPI Chief Complaint  Patient presents with  . Back Pain    mid and lower x 1 week    HPI Comments: Karen Gamble is a 30 y.o. female who presents to the [facility] complaining of constant mid and lower back pain for a week, L side > R side. Pt does not speak English so the video interpreter was used (interpreter#" [id number]). Pt mentions she had lower abdominal pain a few weeks ago. Pt has had this back pain before in the past and came here twice for the same reason. Pt had a x-ray done on her back and was told it was a kidney infection which feels similar to the pain she is currently having. Pt is having trouble getting comfortable and the pain prevents her from going to sleep, but it worsens when she stand up. Pt takes Tylenol for little relief to her symptoms. Pt mentions the last time she was given a muscle relaxer for her symptoms and it gave little relief to her symptoms. Pt's LMP was nl July 7th.  Pt denies doing any heavy lifting at home, but she does a lot of pushing and moving with house chores and yard work- pt cuts the grass, cutting the buses, planting/picking things in the garden. Pt denies previous injury to her back. Pt denies dysuria, difficulty urinating, hematuria, and urinary urgency.  Patient Active Problem List   Diagnosis Date Noted  . Right flank pain 04/28/2015  . Dyspepsia 04/28/2015  . Atypical chest pain 04/28/2015  . Gestational diabetes mellitus, antepartum 12/13/2011  . Pregnancy, supervision, high-risk 09/12/2011  . History of gestational diabetes in prior pregnancy, currently pregnant 09/12/2011    . Cataract of left eye 07/18/2011   Past Medical History:  Diagnosis Date  . Allergy   . Gestational diabetes    2nd pregnancy  . No pertinent past medical history   . Seasonal allergies    Past Surgical History:  Procedure Laterality Date  . CHOLECYSTECTOMY     No Known Allergies Prior to Admission medications   Medication Sig Start Date End Date Taking? Authorizing Provider  fexofenadine (ALLEGRA) 180 MG tablet Take 180 mg by mouth daily.    Historical Provider, MD  predniSONE (DELTASONE) 20 MG tablet Two daily with food Patient not taking: Reported on 12/08/2015 09/10/15   Elvina Sidle, MD   Social History   Social History  . Marital status: Married    Spouse name: N/A  . Number of children: N/A  . Years of education: N/A   Occupational History  . Not on file.   Social History Main Topics  . Smoking status: Never Smoker  . Smokeless tobacco: Never Used  . Alcohol use Yes     Comment: occassionally  . Drug use: No  . Sexual activity: Yes    Birth control/ protection: Condom   Other Topics Concern  . Not on file   Social History Narrative  . No narrative on file   Depression screen [facility] 2/9 03/29/2016 09/10/2015 09/10/2015 04/28/2015 04/17/2015  Decreased Interest 0 0 0 0 0  Down, Depressed, Hopeless 0 1 0 0 0  PHQ -  2 Score 0 1 0 0 0   Review of Systems  Genitourinary: Negative for difficulty urinating, dysuria, hematuria, menstrual problem and urgency.  Musculoskeletal: Negative for back pain.    Objective:  Physical Exam  Constitutional: She appears well-developed and well-nourished. No distress.  HENT:  Head: Normocephalic and atraumatic.  Eyes: Conjunctivae are normal.  Neck: Neck supple.  Cardiovascular: Normal rate.   Pulmonary/Chest: Effort normal.  Abdominal: Soft. There is tenderness (minimal) in the suprapubic area. There is no CVA tenderness.  Musculoskeletal:       Lumbar back: She exhibits tenderness.  Tenderness with some spasm in the  left paraspinal muscle No midline bony tenderness Negatuve steated straight leg raise  Neurological: She is alert.  Reflex Scores:      Patellar reflexes are 2+ on the right side and 2+ on the left side.      Achilles reflexes are 2+ on the right side and 2+ on the left side. Skin: Skin is warm and dry.  Psychiatric: She has a normal mood and affect. Her behavior is normal.  Nursing note and vitals reviewed.  BP 116/72 (BP Location: Right Arm, Patient Position: Sitting, Cuff Size: Normal)   Pulse 68   Temp 98.3 F (36.8 C) (Oral)   Ht 4' 11.5" (1.511 m)   Wt 138 lb 6.4 oz (62.8 kg)   LMP 03/02/2016   BMI 27.49 kg/m    Results for orders placed or performed in visit on 03/29/16  POCT urinalysis dipstick  Result Value Ref Range   Color, UA yellow yellow   Clarity, UA clear clear   Glucose, UA negative negative   Bilirubin, UA negative negative   Ketones, POC UA negative negative   Spec Grav, UA 1.010    Blood, UA negative negative   pH, UA 5.5    Protein Ur, POC negative negative   Urobilinogen, UA 0.2    Nitrite, UA Negative Negative   Leukocytes, UA small (1+) (A) Negative  POCT urine pregnancy  Result Value Ref Range   Preg Test, Ur Negative Negative  POCT Microscopic Urinalysis ([facility])  Result Value Ref Range   WBC,UR,HPF,POC Few (A) None WBC/hpf   RBC,UR,HPF,POC None None RBC/hpf   Bacteria Few (A) None, Too numerous to count   Mucus Absent Absent   Epithelial Cells, UR Per Microscopy Few (A) None, Too numerous to count cells/hpf   Dg Lumbar Spine Complete  Result Date: 03/29/2016 CLINICAL DATA:  Back pain for 1 week.  No known injury. EXAM: LUMBAR SPINE - COMPLETE 4+ VIEW COMPARISON:  None. FINDINGS: Normal alignment of the lumbar vertebral bodies. Disc spaces and vertebral bodies are maintained. The facets are normally aligned. No pars defects. The visualized bony pelvis is intact. IMPRESSION: Normal alignment and no acute bony findings or degenerative changes.  Electronically Signed   By: Rudie Meyer M.D.   On: 03/29/2016 13:54   Assessment & Plan:    Karen Gamble is a 30 y.o. female Suprapubic abdominal pain, unspecified laterality - Plan: POCT urinalysis dipstick, POCT urine pregnancy, POCT Microscopic Urinalysis ([facility]), nitrofurantoin, macrocrystal-monohydrate, (MACROBID) 100 MG capsule, Urine culture  -Start Macrobid. Check urine culture.  Low back pain without sciatica, unspecified back pain laterality - Plan: cyclobenzaprine (FLEXERIL) 5 MG tablet, meloxicam (MOBIC) 7.5 MG tablet, DG Lumbar Spine Complete  -Recurrent, suspect mechanical low back pain. Afebrile, minimal signs of infection on urine, unlikely pyelonephritis.  -Mobic, Flexeril, side effects discussed.  Handout given on low back pain, range of motion/stretches  and recheck in the next 2 weeks if not improving.   Spanish spoken and video interpreter used. Understanding expressed.  Meds ordered this encounter  Medications  . cyclobenzaprine (FLEXERIL) 5 MG tablet    Sig: 1 pill by mouth up to every 8 hours as needed. Start with one pill by mouth each bedtime as needed due to sedation    Dispense:  15 tablet    Refill:  0    Label in spanish  . meloxicam (MOBIC) 7.5 MG tablet    Sig: Take 1 tablet (7.5 mg total) by mouth daily.    Dispense:  30 tablet    Refill:  0    Label in spanish  . nitrofurantoin, macrocrystal-monohydrate, (MACROBID) 100 MG capsule    Sig: Take 1 capsule (100 mg total) by mouth 2 (two) times daily.    Dispense:  14 capsule    Refill:  0   Patient Instructions   macrobid por infeccion posible, pero voy chequiar otro prueba por infeccion. creo su dolor es en sus musculos de espalda. Tome mobic cada dia, cyclobenzaprine en la noche si necesario, ejercisios, y si no esta mejor en 2 semanas regrese. Mas temprano o cuarto de emergencia si empeorse.   Esguince de la cintura con rehabilitacin (Low Back Sprain With Rehab) Un esguince es una  lesin en la que el ligamento se desgarra. Los ligamentos de la cintura son susceptibles de sufrir esguinces. Sin embargo, estos ligamentos son Lynnae Sandhoff fuertes y se requiere de una gran fuerza para lesionarlos. Son importantes para estabilizar la mdula espinal. Los esguinces se clasifican en tres categoras. Los esguinces de grado 1 ocasionan dolor, pero el tendn no est alargado. En los esguinces de grado 2 hay un ligamento alargado, debido a un estiramiento o desgarro parcial. En el esguince de [place] 2 an se mantiene la funcin, aunque sta puede estar alterada. Un esguince en grado 3 es la ruptura completa del msculo o el tendn, y suele quedar incapacitada la funcin. SNTOMAS  Dolor intenso en la cintura.  Sensacin de estallido o ruptura en el momento de la lesin.  Sensibilidad y a veces hinchazn en la zona de la lesin.  Algunas veces, hematoma (contusin) en el lugar de la lesin dentro de las 48 horas.  Espasmos musculares en la espalda. CAUSAS El esguince se produce cuando se aplica una fuerza en el ligamento que es mayor de lo que puede soportar. Las causas ms frecuentes de la lesin son:  Garnetta Buddy actividad estresante en una posicin incmoda.  Actividades estresantes repetidas que implican movimiento de la cintura.  Golpe directo en la cintura (traumatismo). LOS RIESGOS AUMENTAN CON:  Deportes de contacto (ftbol, lucha).  Colisiones (principalmente accidentes de esqu).  Deportes que requieren arrojar o Cabin crew elemento (levantamiento de pesas, bisbol).  Deportes que implican girar la columna (gimnasia, clavados, tenis, golf)  Poca fuerza y flexibilidad.  Proteccin inadecuada.  Cirugas previas en la espalda (especialmente fusin). PREVENCIN  Use el equipo protector adecuado y [employer]  Precalentamiento adecuado y elongacin antes de la [place].  Descanso y recuperacin entre actividades.  Mantener la forma fsica:  [place],  flexibilidad y resistencia muscular.  Capacidad cardiovascular.  Mantenga un peso corporal adecuado. PRONSTICO Si se trata adecuadamente, estos esguinces pueden curarse con tratamiento no quirrgico. El tiempo de curacin depende de la gravedad de la lesin.  posibles complicaciones:  La recurrencia frecuente de los sntomas puede dar como resultado un problema crnico.  Inflamacin crnica y  dolor en la cintura.  Retraso en la curacin o resolucin de los sntomas, en particular si se retoma la actividad rpidamente.  Discapacidad prolongada.  Articulacin inestable o artrtica en la cintura. TRATAMIENTO El tratamiento inicial incluye el uso de medicamentos y la aplicacin de hielo para reducir Chief Technology Officer y la inflamacin. Los ejercicios de elongacin y fortalecimiento pueden ayudar a reducir Chief Technology Officer con la [place]. Los ejercicios pueden Management consultant o con un terapeuta. Los [employer] graves pueden requerir la derivacin a un fisioterapeuta para Magazine features editor evaluacin y Games developer un tratamiento, como ultrasonido. El profesional podr indicarle el uso de un dispositivo ortopdico para ayudar a Glass blower/designer y la inflamacin. A menudo, demasiado reposo en cama podr resultar en ms daos que beneficios. Podrn prescribirle inyecciones de corticoides. Sin embargo, esto deber reservarse para los casos ms graves. Es Therapist, occupational uso de la espalda cuando se levantan objetos. Por la noche, se aconseja que usted [place], sobre un [address] y coloque una almohada debajo de las rodillas. Si no se obtiene xito con Artist, ser necesario someterse a [place].  MEDICAMENTOS   Si es necesaria la administracin de medicamentos para Chief Technology Officer, se recomiendan los antiinflamatorios no esteroides, como aspirina e ibuprofeno y otros calmantes menores, como acetaminofeno.  No tome medicamentos para el dolor dentro de los [address] previos a la  [place].  El profesional podr prescribirle calmantes si lo considera necesario. Utilcelos como se le indique y slo cuando lo necesite.  Podr beneficiarse con Teachers Insurance and An[employer]ity Association.  En algunos casos se indica una inyeccin de corticosteroides. Estas inyecciones deben reservarse para los [place] graves, porque slo se pueden administrar una determinada cantidad de veces. CALOR Y FRO   El fro (con hielo) debe aplicarse durante 10 a 15 minutos cada 2  3 horas para reducir la inflamacin y Chief Technology Officer e inmediatamente despus de cualquier actividad que agrava los sntomas. Utilice bolsas o un masaje de hielo.  El calor puede usarse antes de Therapist, music y de las actividades de fortalecimiento indicadas por el profesional, le fisioterapeuta o Orthoptist. Utilice una bolsa trmica o un pao hmedo. SOLICITE ATENCIN MDICA SI:   Los sntomas empeoran o no mejoran en 2 a 4 semanas, an realizando Pharmacist, community.  Presenta adormecimiento o debilidad en alguna de las piernas.  Prdida del control del intestino o de la vejiga.  Luego de la ciruga observa lo siguiente: fiebre, dolor intenso, hinchazn, enrojecimiento, drena lquido o sangra en la regin de la herida.  Desarrolla nuevos e inexplicables sntomas. (Los [employer] utilizados en el tratamiento le ocasionan efectos secundarios). EJERCICIOS  EJERCICIOS DE AMPLITUD DE MOVIMIENTOS Cherlynn June - Esguince de cintura La mayora de las personas con dolor de espalda baja encuentran que sus sntomas empeoran al doblarse hacia adelante (flexin) o al arquear la regin inferior de la espalda (extensin). Los ejercicios que le ayudarn a Oncologist sus sntomas se Research scientist (life sciences).  El mdico, fisioterapeuta o Research scientist (physical sciences) ayudarn a Chief Strategy Officer qu ejercicios sern de ayuda para resolver su dolor de espalda. No realice ningn ejercicio sin consultarlo antes con el profesional. Discontine los ejercicios que empeoran sus sntomas,  hasta que hable con el mdico. Si siente dolor, entumecimiento u hormigueo que [employer] glteos, piernas o pies, el objetivo de esta terapia es que estos sntomas se acerquen a la espalda y Customer service manager. A veces, estos sntomas en las piernas mejoran, Development worker, international aid  de espalda empeora. Este suele ser un indicio de progreso en su rehabilitacin. Asegrese de que estar atento a cualquier cambio en sus sntomas y las actividades que ha [employer]	[place] 24 horas antes del cambio. Compartir esta informacin con su mdico le permitir un mejor tratamiento para tratar su enfermedad. Estos ejercicios le ayudarn en la recuperacin de la lesin. Los sntomas podrn aliviarse con o sin asistencia adicional de su mdico, fisioterapeuta o Herbalist. Al completar estos ejercicios, recuerde:   Restaurar la flexibilidad del tejido ayuda a que las articulaciones recuperen el movimiento normal. Esto permite que el movimiento y la actividad sea ms saludables y menos dolorosos.  Para que sea efectiva, cada elongacin debe realizarse durante al menos 30 segundos.  La elongacin nunca debe ser dolorosa. Deber sentir slo un alargamiento o distensin suave del tejido que estira. EJERCICIOS DE AMPLITUD DE MOVIMIENTOS Y ELONGACIN: ELONGACION Flexin - una rodilla al pecho  Recustese en una cama dura o sobre el piso, con ambas piernas extendidas al frente.  Manteniendo una pierna en contacto con el piso, lleve la rodilla opuesta al pecho. Mantenga la pierna en esa posicin, sostenindola por la zona posterior del muslo o por la rodilla.  Presione hasta sentir un suave estiramiento en la cintura. Mantenga esta posicin durante __________ segundos.  Libere la pierna lentamente y repita el ejercicio con el lado opuesto. Reptalo __________ veces. Realice este ejercicio __________ veces por da.  ELONGACIN - Flexin, dos rodillas al pecho   Recustese en una cama dura o sobre el piso, con ambas  piernas extendidas al frente.  Manteniendo una pierna en contacto con el piso, lleve la rodilla opuesta al pecho.  Tense los msculos del estmago para apoyar la espalda y levante la otra rodilla [place]. Mantenga las piernas en su lugar y tmese por detrs de las caderas o las rodillas.  Con ambas rodillas en el pecho, tire hasta que sienta un estiramiento en la parte trasera de la espalda. Mantenga esta posicin durante __________ segundos.  Tense los msculos del estmago y baje las piernas de a una por vez. Reptalo __________ veces. Realice este ejercicio __________ veces por da.  ELONGACIN - Rotacin de la zona baja del tronco  Recustese sobre una cama firme o sobre el suelo. Mantenga las piernas al frente, doble las rodillas de modo que ambas apunten hacia el techo y los pies queden bien apoyados en el piso.  Extienda los brazos a Dance movement psychotherapist. Esto estabilizar la zona superior del cuerpo, manteniendo los hombros en contacto con el piso.  Con cuidado y lentamente deje caer ambas rodillas juntas hacia un lado, hasta que sienta un suave estiramiento en la espalda baja. Mantenga esta posicin durante __________ segundos.  Tensione los [employer] del estmago para Occupational psychologist la cintura mientras lleva las rodillas nuevamente a la posicin inicial. Repita el ejercicio hacia el otro lado. Reptalo __________ veces. Realice este ejercicio __________ veces por da. EJERCICIOS DE AMPLITUD DE MOVIMIENTOS Y FLEXIBILIDAD: ELONGACIN - Extensin posicin prona sobre los codos  Acustese sobre el estmago sobre el piso, una cama ser muy blanda. Coloque las palmas a una distancia igual al ancho de los hombres y a la altura de la cabeza.  Coloque los codos bajo los hombros. Si siente dolor, colquese almohadas debajo del pecho.  Deje que su cuerpo se relaje, de modo que las caderas queden ms abajo y tengan ms contacto con el piso.  Mantenga esta posicin durante __________  segundos.  Vuelva lentamente a  la posicin plana sobre el piso. Reptalo __________ veces. Realice este ejercicio __________ veces por da.  AMPLITUD DE MOVIMIENTOS - Extensin - flexin de brazos en posicin prona  Acustese sobre el [employer]	[place] piso, una cama ser [place]. Coloque las palmas a una distancia igual al ancho de los hombres y a la altura de la cabeza.  Mantenga la espalda tan relajada como pueda, enderece lentamente los codos [address] las caderas contra el suelo. Puede modificar la posicin de las manos para estar ms cmodo. A medida que gana movimiento, sus manos quedarn ms por debajo de los hombros.  Mantenga cada posicin durante __________ segundos.  Vuelva lentamente a la posicin plana sobre el piso. Reptalo __________ veces. Realice este ejercicio __________ veces por da.  AMPLITUD DE MOVIMIENTOS - Cuadrpedo [place] vertebral neutral  Coloque las manos y las rodillas en una superficie firme. Las manos deben quedar a la altura de los hombros y las rodillas debajo de las caderas. Puede colocar algo debajo las rodillas para estar ms cmodo.  Haga caer la cabeza y apunte el cccix hacia el suelo debajo de usted. De este modo se redondear la cintura, en [place] similar a un gato enojado. Mantenga esta posicin durante __________ segundos.  Lentamente levante la cabeza y afloje el cccix para que se hunda el cuerpo en un gran arco, como un caballo.  Mantenga esta posicin durante __________ segundos.  Reptalo hasta sentir calor en la cintura.  Ahora encuentre su "punto ideal". Ser la posicin ms cmoda [employer]. En esta posicin es cuando su columna est neutral. Una vez que encuentre la posicin, tensione los msculos del estmago para sostener la zona inferior de la espalda.  Mantenga esta posicin durante __________ segundos. Reptalo __________ veces. Realice este ejercicio __________ veces por da.   EJERCICIOS DE FORTALECIMIENTO - Esguince de la cintura Estos ejercicios le ayudarn en la recuperacin de la lesin. Estos ejercicios deben hacerse cerca de su "punto dulce". Este es el arco neutro, de la parte baja de la espalda, en algn lugar entre la posicin completamente redondeada y arqueada plenamente, que es la posicin menos dolorosa. Cuando se realiza en [employer] de seguridad del movimiento, estos ejercicios se pueden Chemical engineer para las personas que tienen una lesin basada en flexin o extensin. Con estos ejercicios, los sntomas podrn desaparecer con o sin mayor intervencin del profesional, el fisioterapeuta o Orthoptist. Al completar estos ejercicios, recuerde:   Los msculos pueden ganar la resistencia y la fuerza necesarias para las actividades diarias a travs de ejercicios controlados.  Realice los ejercicios como se lo indic el mdico, el fisioterapeuta o Orthoptist. Aumente la resistencia y las repeticiones segn se le haya indicado.  Podr experimentar dolor o cansancio muscular, pero el dolor o molestia que trata de eliminar a travs de los ejercicios nunca debe empeorar. Si el dolor empeora, detngase y asegrese de que est siguiendo las directivas correctamente. Si an siente dolor luego de Education officer, environmental lo ajustes necesarios, deber discontinuar el ejercicio hasta que pueda conversar con el profesional sobre el problema. FORTALECIMIENTO - Abdominales profundos - Inclinacin plvica  Recustese sobre una cama firme o sobre el suelo. Mantenga las piernas al frente, doble las rodillas de modo que ambas apunten hacia el techo y los pies queden bien apoyados en el piso.  Tensione la zona baja de los msculos abdominales para presionar la Oncologist. Este movimiento har rotar su pelvis de modo que el cccix  quede hacia arriba y no apuntando a los pies o [employer]. Con una tensin suave y respiracin pareja, mantenga esta posicin durante __________  segundos. Reptalo __________ veces. Realice este ejercicio __________ veces por da.  FORTALECIMIENTO - Abdominales encogimiento abdominal.  Recustese sobre una cama firme o sobre el suelo. Mantenga las piernas al frente, doble las rodillas de modo que ambas apunten hacia el techo y los pies queden bien apoyados en el piso. Cruce las [employer].  Apunte suavemente con la barbilla hacia abajo, sin doblar el cuello.  Tensione los abdominales y eleve lentamente el tronco la altura suficiente para despegar los omplatos. Si se eleva ms, pondr tensin excesiva en la cintura y esto no fortalecer ms los abdominales.  Controle la vuelta a la posicin inicial. Reptalo __________ veces. Realice este ejercicio __________ veces por da.  EN CUATRO MIEMBROS - Cuadrpedo, elevacin de miembro superior e inferior opuestos   [employer] y las rodillas en una superficie firme. Las manos deben quedar a la altura de los hombros y las rodillas debajo de las caderas. Puede colocar algo debajo las rodillas para estar ms cmodo.  Encuentre la posicin neutral de la columna vertebral y Public house manager los msculos abdominales de modo que pueda mantener esta posicin. Los hombros y las caderas deben formar un rectngulo paralelo con el suelo y recto.  Manteniendo el tronco firme, eleve la mano derecha a la altura del hombro y luego eleve la pierna izquierda a la altura de la cadera. Asegrese de no contener la respiracin. Mantenga esta posicin durante __________ segundos.  Con los msculos abdominales en tensin y la espalda firme, vuelva lentamente a la posicin inicial. Repita con el otro brazo y la otra pierna.  Reptalo __________ veces. Realice este ejercicio __________ veces por da. FUERZA - Abdominales y cudriceps - Levantar las piernas rectas  Recustese en una cama dura o sobre el piso, con ambas piernas extendidas al frente.  Deje una pierna en contacto con el suelo y  doble la otra rodilla de manera que el pie quede contra el suelo.  Encuentre la posicin neutral de la columna vertebral y Public house manager los msculos abdominales de modo que pueda mantener esta posicin.  Levante lentamente la pierna del suelo una 6 pulgadas y cuente [place] 15, asegrese de no contener la respiracin.  Mantega la columna en posicin neutral, y baje lentamente la pierna hasta el suelo. Repita el ejercicio con cada pierna __________ veces. Realice este ejercicio __________ veces por da. CONSIDERACIONES ACERCA DE LA POSTURA Y LA MECNICA DEL CUERPO  Esguince de la cintura Si mantiene una postura correcta cuando se encuentre de pie, sentado o realizando sus actividades, reducir el estrs [employer] Insurance and An[employer]ity Association diferentes tejidos del cuerpo, y Acupuncturist a los tejidos lesionados la posibilidad de curarse y Film/video editor las experiencias dolorosas. A continuacin se indican pautas generales para mejorar la postura. Su mdico o fisioterapeuta le dar instrucciones especficas segn sus necesidades. Al leer estas pautas recuerde:  Los ejercicios indicados por su mdico lo ayudarn a Chartered certified accountant flexibilidad y la fuerza para Pharmacologist las posturas correctas.  La postura correcta proporciona el mejor entorno de trabajo para las articulaciones. Las articulaciones se desgastan menos cuando estn sostenidas adecuadamente por una columna vertebral en buena postura. Esto significa que su cuerpo estar ms sano y Research officer, trade union.  La correcta postura debe practicarse en todas las actividades, especialmente al estar sentado o de pie durante [place]. Tambin es importante al Arts development officer  repetitivas de bajo estrs (tipeo) o una actividad nica y pesada. POSICIONES DE Dellie Catholic Tenga en cuenta cules son las posturas que ms dolor le causan al elegir una posicin de descanso. Si siente dolor con las actividades en que deba realizar una flexin (sentarse, inclinarse, detenerse, ponerse en cuclillas), elija una  posicin que le permita descansar en una postura menos flexionada. Evite curvarse en posicin fetal cuando se encuentre de lado. Si el dolor empeora con las actividades basadas en la extensin (estar de pie durante un tiempo prolongado, trabajar con las manos por arriba de la cabeza) evite descansar en Neomia Dear posicin extendida durante mucho tiempo, como dormir sobre el [place]. La [employer] de las Psychologist, forensic cmodo el descanso sobre la columna vertebral en una posicin neutral, ni muy redondeada ni [place]. Recustese sobre su lado en una cama que no est hundida con una almohada entre las rodillas o sobre la espalda con una almohada bajo las rodillas, y sentir [place]. Tenga en cuenta que cualquier posicin en Google, no importa si es una postura [place], puede provocarle rigidez. POSTURAS CORRECTAS PARA SENTARSE Con el fin de minimizar el estrs y Environmental health practitioner en su columna, deber sentarse con la postura correcta. Sentarse con una buena postura debe ser algo sin esfuerzo para un cuerpo sano. Recuperar una buena postura es un proceso gradual. Muchas personas pueden trabajar ms cmodas mediante el uso de diferentes soportes hasta que tengan la flexibilidad y la fuerza para mantener esta postura por su cuenta. Al sentarse con la [employer], los odos deben estar sobre los hombros y los hombros sobre las caderas. Debe utilizar el respaldo de la silla para apoyar la espalda. La espalda estar en una posicin neutral, ligeramente arqueada. Puede colocar una pequea almohada o toalla doblada en la base de la espalda baja para apoyo.  Si trabaja en un escritorio, cree un ambiente que le proporciones un buen soporte y [place]. Sin apoyo adicional, msculos se cansan, lo que lleva a una tensin excesiva en las articulaciones y otros tejidos. Tenga en cuenta estas recomendaciones: SILLA:   La silla debe poder deslizarse por debajo del escritorio cuando su espalda tome  contacto con el respaldo. Esto le permitir trabajar ms cerca.  La altura de la silla debe permitirle que los ojos tengan el nivel de la parte superior del monitor y las manos estn ms abajo que los codos. POSICIN DEL CUERPO  Los pies deben tener contacto con el piso. Si no es posible, use un posapies.  Mantenga las [employer] hombros. Esto reducir el estrs en el cuello y en la cintura. POSTURAS INCORRECTAS PARA SENTARSE Si se siente cansado e incapaz de asumir una postura sentada sana, no se eche hacia atrs. Esto pone una tensin excesiva en los tejidos de su espalda, y causa ms dao y Engineer, mining. Entre las opciones ms saludables se incluyen:  El uso de ms apoyo, como una almohada lumbar.  Cambio de tareas, a algo que demande una posicin vertical o caminar.  Tomar una breve caminata.  Recostarse y Theatre stage manager posicin neutral. DE PIE DURANTE UN TIEMPO PROLONGADO E INCLINADO LIGERAMENTE HACIA ADELANTE Cuando deba realizar una tarea que requiera inclinacin hacia adelante estando de pie en el mismo sitio durante mucho tiempo, coloque un pie en un objeto de 2 a 4 pulgadas de alto, para [employer]. Cuando ambos pies estn en el piso, la zona inferior de la espalda tiene a perder su Loss adjuster, chartered  adentro. Si esta curva se aplana (o se pronuncia demasiado) la espalda y las articulaciones experimentarn demasiado estrs, se fatigarn ms rpidamente y Geophysicist/field seismologist.  POSTURAS CORRECTAS PARA ESTAR DE PIE Una postura adecuada de pie realizarse en todas las actividades diarias, incluso si slo toman un momento, como al [employer]. Como en la postura de sentado, los odos deben estar sobre los hombros y los hombros sobre las caderas. Deber mantener una ligera tensin en sus msculos abdominales para asegurar la columna vertebral. El cccix debe apuntar hacia el suelo, no detrs de su cuerpo, ya que resultara en una curvatura de la espalda  sobre-extendida.  POSTURAS INCORRECTAS PARA ESTAR DE PIE Las posturas incorrectas para estar de pie incluyen tener la cabeza hacia delante, las rodillas bloqueadas o una excesiva curvatura de la espalda. CAMINAR Camine en Deborha Payment erguida. Las [place], hombros y caderas deben estar alineados. ACTIVIDAD PROLONGADA EN UNA POSICIN FLEXIONADA Al completar una tarea que requiere que se doble la cintura hacia adelante o inclinarse sobre una superficie baja, trate de encontrar una manera de estabilizar 3 de cada 4 de sus miembros. Puede colocar una mano o el codo en el [place], o descansar una rodilla en la superficie en la que est apoyado. Esto le proporcionar ms estabilidad para que sus msculos no se cansen tan rpidamente. El [employer] rodillas [place], o ligeramente dobladas, tambin reducir el estrs en la espalda baja. TCNICAS CORRECTAS PARA LEVANTAR OBJETOS SI:   Asumir una postura amplia. Esto le proporcionar ms estabilidad y la oportunidad de acercarse lo ms posible al objeto que se est levantando.  Tense los abdominales para asegurar la columna vertebral. Doble las rodillas y las caderas. Manteniendo la espalda en una posicin neutral, haga el esfuerzo con los msculos de la pierna. Levntese con las piernas, manteniendo la espalda derecha.  Pruebe el peso de los objetos desconocidos antes de tratar de Secondary school teacher.  Trate de [employer] codos hacia abajo y a los lados, con el fin de obtener la fuerza de los hombros al llevar un objeto.  Siempre pida ayuda a otra persona cuando deba levantar objetos pesados o incmodos. TCNICAS INCORRECTAS PARA LEVANTAR OBJETOS NO:   Bloquee rodillas al levantar, aunque sea un objeto pequeo.  Se doble ni gire. Gire sobre los pies ni los mueva cuando necesite cambiar de direccin.  Considere que no puede levantar incluso un clip de papel con seguridad, sin Clinical biochemist.   Esta informacin no tiene Theme park manager el  consejo del mdico. Asegrese de hacerle al mdico cualquier pregunta que tenga.   Document Released: 05/30/2006 Document Revised: 12/28/2014 Elsevier Interactive Patient Education [employer]    IF you received an x-ray today, you will receive an invoice from [facility]. Please contact [facility] at [phone] with questions or concerns regarding your invoice.   IF you received labwork today, you will receive an invoice from [employer]. Please contact [facility] at [phone] with questions or concerns regarding your invoice.   Our billing staff will not be able to assist you with questions regarding bills from these companies.  You will be contacted with the lab results as soon as they are available. The fastest way to get your results is to activate your My Chart account. Instructions are located on the last page of this paperwork. If you have not heard from [place] regarding the results in 2 weeks, please contact this office.

## 2016-03-30 LAB — URINE CULTURE: Organism ID, Bacteria: NO GROWTH

## 2016-07-10 ENCOUNTER — Other Ambulatory Visit (HOSPITAL_COMMUNITY): Payer: Self-pay | Admitting: *Deleted

## 2016-07-10 DIAGNOSIS — N644 Mastodynia: Secondary | ICD-10-CM

## 2016-08-01 ENCOUNTER — Other Ambulatory Visit (HOSPITAL_COMMUNITY): Payer: Self-pay | Admitting: *Deleted

## 2016-08-01 DIAGNOSIS — N6320 Unspecified lump in the left breast, unspecified quadrant: Secondary | ICD-10-CM

## 2016-08-02 ENCOUNTER — Ambulatory Visit
Admission: RE | Admit: 2016-08-02 | Discharge: 2016-08-02 | Disposition: A | Discharge status: Home / Self Care | Payer: No Typology Code available for payment source | Source: Ambulatory Visit | Attending: Obstetrics and Gynecology | Admitting: Obstetrics and Gynecology

## 2016-08-02 ENCOUNTER — Ambulatory Visit (HOSPITAL_COMMUNITY)
Admission: RE | Admit: 2016-08-02 | Discharge: 2016-08-02 | Disposition: A | Discharge status: Home / Self Care | Payer: Self-pay | Source: Ambulatory Visit | Attending: Obstetrics and Gynecology | Admitting: Obstetrics and Gynecology

## 2016-08-02 ENCOUNTER — Encounter (HOSPITAL_COMMUNITY): Payer: Self-pay

## 2016-08-02 VITALS — BP 110/70 | Temp 98.3°F | Ht 59.0 in | Wt 141.8 lb

## 2016-08-02 DIAGNOSIS — Z1239 Encounter for other screening for malignant neoplasm of breast: Secondary | ICD-10-CM

## 2016-08-02 DIAGNOSIS — N6320 Unspecified lump in the left breast, unspecified quadrant: Secondary | ICD-10-CM

## 2016-08-02 DIAGNOSIS — N644 Mastodynia: Secondary | ICD-10-CM

## 2016-08-02 NOTE — Patient Instructions (Signed)
Explained breast self awareness with Karen Gamble. Patient did not need a Pap smear today due to last Pap smear was 11/18/2015. Let her know BCCCP will cover Pap smears every 3 years unless has a history of abnormal Pap smears. [facility] breast diagnostic mamm[facility] and possible breast ultrasound. Appointment scheduled for Thursday, August 02, 2016 at 1500. Karen Gamble verbalized understanding.  Karen Gamble, Karen Maserhristine Poll, RN 3:23 PM

## 2016-08-02 NOTE — Progress Notes (Signed)
Complaints of two left breast lumps x 6-7 months that have increased in size and pain. Patient states the pain comes and goes. Patient rates the pain at a 6-7 out of 10.  Pap Smear: Pap smear not completed today. [facility] and normal per patient. Per patient has no history of an abnormal Pap smear. No Pap smear results in EPIC.  Physical exam: Breasts Breasts symmetrical. No skin abnormalities bilateral breasts. No nipple retraction bilateral breasts. No nipple discharge bilateral breasts. No lymphadenopathy. No lumps palpated bilateral breasts. No lumps palpated at patient's areas of concern. Complaints of left breast pain at 3 o'clock 4 cm from the nipple and 9:30 o'clock 3.5 cm from the nipple on exam. [facility] for a left breast diagnostic mammogram and possible breast ultrasound. Appointment scheduled for Thursday, August 02, 2016 at 1500.        Pelvic/Bimanual No Pap smear completed today since last Pap smear was 11/18/2015. Pap smear not indicated per BCCCP guidelines.   Smoking History: Patient has never smoked.  Patient Navigation: Patient education provided. Access to services provided for patient through [facility] program. Spanish interpreter provided.  Used Spanish interpreter [employer]quel Mora from [facility].

## 2016-08-03 ENCOUNTER — Encounter (HOSPITAL_COMMUNITY): Payer: Self-pay | Admitting: *Deleted

## 2016-10-12 ENCOUNTER — Ambulatory Visit
Admission: RE | Admit: 2016-10-12 | Discharge: 2016-10-12 | Disposition: A | Discharge status: Home / Self Care | Payer: No Typology Code available for payment source | Source: Ambulatory Visit | Attending: Infectious Disease | Admitting: Infectious Disease

## 2016-10-12 ENCOUNTER — Other Ambulatory Visit: Payer: Self-pay | Admitting: Infectious Disease

## 2016-10-12 DIAGNOSIS — Z111 Encounter for screening for respiratory tuberculosis: Secondary | ICD-10-CM

## 2016-10-25 ENCOUNTER — Ambulatory Visit (INDEPENDENT_AMBULATORY_CARE_PROVIDER_SITE_OTHER): Payer: Self-pay | Admitting: Neurology

## 2016-10-25 ENCOUNTER — Encounter: Payer: Self-pay | Admitting: Neurology

## 2016-10-25 VITALS — BP 110/80 | HR 78 | Ht 59.5 in | Wt 143.6 lb

## 2016-10-25 DIAGNOSIS — H4710 Unspecified papilledema: Secondary | ICD-10-CM

## 2016-10-25 DIAGNOSIS — R29898 Other symptoms and signs involving the musculoskeletal system: Secondary | ICD-10-CM

## 2016-10-25 DIAGNOSIS — R519 Headache, unspecified: Secondary | ICD-10-CM

## 2016-10-25 DIAGNOSIS — R51 Headache: Secondary | ICD-10-CM

## 2016-10-25 NOTE — Patient Instructions (Signed)
My recommendation is to get an MRI of the brain with and without contrast and MRA/MRV of the head.  I recommend that you apply for the [facility] Assistance Program to help cover the cost.  I want to see you back afterwards to discuss next step. We may need to perform a spinal tap to see if there is increased pressure in the head.

## 2016-10-25 NOTE — Progress Notes (Signed)
NEUROLOGY CONSULTATION NOTE  Karen Gamble MRN: [id number] DOB: 05/12/1986  Referring provider: Herschel Senegal, OD Primary care provider: No PCP  Reason for consult:  Headache, papilledema  HISTORY OF PRESENT ILLNESS: Karen Gamble is a 31 year old Spanish-speaking female who presents for right sided headache and evaluation for intracranial hypertension.  She is accompanied by a friend who helps supplement history.  A Spanish-speaking interpretor is present to translate..  She has had a headache for over a year.  It is right sided 5/10 pressure-like headache radiating from the back of her head to the front and behind the left eye.  She does have some neck pain.  It lasts all day and occurs 2 days a week.  Triggers include chocolate.  She usually takes Tylenol, which helps as well.  She sometimes has dizziness.  The headache is not positional.  She denies nausea, photophobia, phonophobia, visual obscurations, pulsatile tinnitus, unilateral weakness or numbness.  Sometimes, she sees a little black spot in the vision of her right eye that moves around, "like a spider".  It occurs with and without the headache.  She notices it if she is looking at something white.  It does not always occur but she notices it daily.  As per the optometrist's referral note, she had imaging to rule out an ocular foreign body, which was reportedly negative.  On the optometrist's exam, she was found to have right optic nerve head elevation and possible mild optic nerve edema.    Of note, she sometimes has episodes where she will suddenly drop objects from her right hand.  It occurs suddenly.  It feels like her arm is overworked but she had not performed any strenuous exercise.  There is no associated pain, numbness or weakness.  It occurs rarely.  PAST MEDICAL HISTORY: Past Medical History:  Diagnosis Date  . Allergy   . Gestational diabetes    2nd pregnancy  . No pertinent past medical  history   . Seasonal allergies     PAST SURGICAL HISTORY: Past Surgical History:  Procedure Laterality Date  . CHOLECYSTECTOMY      MEDICATIONS: Current Outpatient Prescriptions on File Prior to Visit  Medication Sig Dispense Refill  . cyclobenzaprine (FLEXERIL) 5 MG tablet 1 pill by mouth up to every 8 hours as needed. Start with one pill by mouth each bedtime as needed due to sedation 15 tablet 0  . fexofenadine (ALLEGRA) 180 MG tablet Take 180 mg by mouth daily.    . meloxicam (MOBIC) 7.5 MG tablet Take 1 tablet (7.5 mg total) by mouth daily. 30 tablet 0  . vitamin E 400 UNIT capsule Take 400 Units by mouth daily. 2 caps daily     No current facility-administered medications on file prior to visit.     ALLERGIES: No Known Allergies  FAMILY HISTORY: Family History  Problem Relation Age of Onset  . Diabetes Sister   . Diabetes Sister   . Diabetes Sister   . Diabetes Sister     SOCIAL HISTORY: Social History   Social History  . Marital status: Married    Spouse name: N/A  . Number of children: N/A  . Years of education: N/A   Occupational History  . Not on file.   Social History Main Topics  . Smoking status: Never Smoker  . Smokeless tobacco: Never Used  . Alcohol use Yes     Comment: occassionally  . Drug use: No  . Sexual activity: Yes  Birth control/ protection: Condom   Other Topics Concern  . Not on file   Social History Narrative  . No narrative on file    REVIEW OF SYSTEMS: Constitutional: No fevers, chills, or sweats, no generalized fatigue, change in appetite Eyes: No visual changes, double vision, eye pain Ear, nose and throat: No hearing loss, ear pain, nasal congestion, sore throat Cardiovascular: No chest pain, palpitations Respiratory:  No shortness of breath at rest or with exertion, wheezes GastrointestinaI: No nausea, vomiting, diarrhea, abdominal pain, fecal incontinence Genitourinary:  No dysuria, urinary retention or  frequency Musculoskeletal:  No neck pain, back pain Integumentary: No rash, pruritus, skin lesions Neurological: as above Psychiatric: No depression, insomnia, anxiety Endocrine: No palpitations, fatigue, diaphoresis, mood swings, change in appetite, change in weight, increased thirst Hematologic/Lymphatic:  No purpura, petechiae. Allergic/Immunologic: no itchy/runny eyes, nasal congestion, recent allergic reactions, rashes  PHYSICAL EXAM: Vitals:   10/25/16 0754  BP: 110/80  Pulse: 78   General: No acute distress.  Patient appears well-groomed.  Head:  Normocephalic/atraumatic Eyes:  fundi examined but not visualized Neck: supple, no paraspinal tenderness, full range of motion Back: No paraspinal tenderness Heart: regular rate and rhythm Lungs: Clear to auscultation bilaterally. Vascular: No carotid bruits. Neurological Exam: Mental status: alert and oriented to person, place, and time, recent and remote memory intact, fund of knowledge intact, attention and concentration intact, speech fluent and not dysarthric, language intact. Cranial nerves: CN I: not tested CN II: pupils equal, round and reactive to light, visual fields intact CN III, IV, VI:  full range of motion, no nystagmus, no ptosis CN V: facial sensation intact CN VII: upper and lower face symmetric CN VIII: hearing intact CN IX, X: gag intact, uvula midline CN XI: sternocleidomastoid and trapezius muscles intact CN XII: tongue midline Bulk & Tone: normal, no fasciculations. Motor:  5/5 throughout Sensation: temperature and vibration sensation intact. Deep Tendon Reflexes:  2+ throughout, toes downgoing.  Finger to nose testing:  Without dysmetria.  Heel to shin:  Without dysmetria.  Gait:  Normal station and stride.  Able to turn and tandem walk. Romberg negative.  IMPRESSION: 1.  Papilledema in right eye on optometry exam.  May be incidental finding as her headache and symptoms do not correlate with  idiopathic intracranial hypertension.  May be cervicogenic in nature.  Probably migraine, given that chocolate is a trigger. 2.  Intermittent episodes of dropping objects from her right hand.  Unclear etiology.  She notes that her arm feels tired at the time, but denies weakness, pain and numbness.  PLAN: I believe she needs MRI of brain with and without contrast to evaluate for mass lesion or abnormal enhancement consistent with increased intracranial hypertension.  I also think she needs MRV and MRA of head to rule out venous sinus thrombosis, stenosis and cerebral aneurysm.  It is not clear what are these episodes of dropping objects, but that is another reason to get an MRI.  I also explained that the next step may be a lumbar puncture.   I offered to prescribe a preventative medication to treat the headache (such as topiramate), but she wishes not to start any medications now.  She would like to see what the workup shows.    She is without insurance, so I recommended that she sign up for the [facility] Assistance Program, so we can perform these needed tests.  She will follow up afterwards.  Thank you for allowing me to take part in the care of  this patient.  Shon Millet, DO  CC:  Estill Bamberg, OD

## 2016-10-31 ENCOUNTER — Telehealth: Payer: Self-pay | Admitting: Neurology

## 2016-10-31 NOTE — Telephone Encounter (Signed)
We will send referral to [facility]. Delia made aware.

## 2016-10-31 NOTE — Telephone Encounter (Signed)
Patient friend Wendi MayaDelia called [phone] and states that patient will pay out of pocket for the MRI and order needs to be sent [facility] [address]

## 2016-11-15 ENCOUNTER — Encounter: Payer: Self-pay | Admitting: Neurology

## 2016-11-21 ENCOUNTER — Telehealth: Payer: Self-pay

## 2016-11-21 ENCOUNTER — Other Ambulatory Visit: Payer: Self-pay | Admitting: *Deleted

## 2016-11-21 DIAGNOSIS — R519 Headache, unspecified: Secondary | ICD-10-CM

## 2016-11-21 DIAGNOSIS — R51 Headache: Principal | ICD-10-CM

## 2016-11-21 NOTE — Telephone Encounter (Signed)
Order placed and [facility] to schedule.

## 2016-11-21 NOTE — Telephone Encounter (Signed)
Called patient using the Spanish interperter line: [phone]. Gave MRI results and LP instructions. Patient verbalized understanding.  Ordered LP.

## 2016-11-21 NOTE — Telephone Encounter (Signed)
-----   Message from Drema DallasAdam R Jaffe, DO sent at 11/21/2016  7:12 AM EDT ----- I reviewed Ms. Karen Gamble's MRIs from 11/15/16: MRI of brain shows narrowing of a blood vessel which may cause increased pressure in the head, which may be contributing to the headaches.  As discussed, the next step would be a lumbar puncture.  I would like to check CSF for opening pressure, cell count with diff, protein, glucose and gram stain with culture.  I will need to see her shortly afterwards to discuss next step.

## 2016-11-21 NOTE — Telephone Encounter (Signed)
Called hp# no answer.  Called mobile#. Spouse states he was not home, but would tell pt to return call.

## 2016-11-27 NOTE — Discharge Instructions (Signed)

## 2016-11-28 ENCOUNTER — Inpatient Hospital Stay
Admission: RE | Admit: 2016-11-28 | Discharge: 2016-11-28 | Disposition: A | Discharge status: Home / Self Care | Payer: Self-pay | Source: Ambulatory Visit | Attending: Neurology | Admitting: Neurology

## 2017-05-23 ENCOUNTER — Telehealth: Payer: Self-pay | Admitting: Neurology

## 2017-05-23 NOTE — Telephone Encounter (Signed)
Delia called for Patient. She lost her referral she was given from Dr. Everlena Cooper for therapy? Please Advise. Delia said she could come by tomorrow and pick it Up. Thanks

## 2017-05-24 NOTE — Telephone Encounter (Signed)
Stopped by today regarding the referral to pick up. Please Call. Thanks

## 2017-05-27 NOTE — Telephone Encounter (Signed)
Spoke with Delia, Pt wishes to have LP now, since it has been so long, would you need to see her again?

## 2017-05-27 NOTE — Telephone Encounter (Signed)
Schedule LP for opening pressure and CSF analysis of cell count, gram stain and culture, protein and glucose.  She should schedule follow up appointment with me afterward.

## 2017-05-28 ENCOUNTER — Other Ambulatory Visit: Payer: Self-pay

## 2017-05-28 DIAGNOSIS — R519 Headache, unspecified: Secondary | ICD-10-CM

## 2017-05-28 DIAGNOSIS — R51 Headache: Principal | ICD-10-CM

## 2017-05-28 NOTE — Telephone Encounter (Signed)
Spoke With [employer], she wanted me to set up at [facility], I called [facility], they no longer do LP on O/P basis, they refer to [facility] now, called Delia, advsd her will put in orders for [facility], advsd her to call them tomorrow to check on price. She agreed.

## 2017-06-14 ENCOUNTER — Ambulatory Visit
Admission: RE | Admit: 2017-06-14 | Discharge: 2017-06-14 | Disposition: A | Discharge status: Home / Self Care | Payer: No Typology Code available for payment source | Source: Ambulatory Visit | Attending: Neurology | Admitting: Neurology

## 2017-06-14 ENCOUNTER — Other Ambulatory Visit: Payer: Self-pay | Admitting: Neurology

## 2017-06-14 DIAGNOSIS — R519 Headache, unspecified: Secondary | ICD-10-CM

## 2017-06-14 DIAGNOSIS — R51 Headache: Principal | ICD-10-CM

## 2017-06-14 NOTE — Discharge Instructions (Signed)

## 2017-06-17 ENCOUNTER — Ambulatory Visit
Admission: RE | Admit: 2017-06-17 | Discharge: 2017-06-17 | Disposition: A | Discharge status: Home / Self Care | Payer: Self-pay | Source: Ambulatory Visit | Attending: Neurology | Admitting: Neurology

## 2017-06-17 ENCOUNTER — Telehealth: Payer: Self-pay | Admitting: Neurology

## 2017-06-17 ENCOUNTER — Telehealth: Payer: Self-pay

## 2017-06-17 ENCOUNTER — Other Ambulatory Visit: Payer: Self-pay

## 2017-06-17 DIAGNOSIS — G971 Other reaction to spinal and lumbar puncture: Secondary | ICD-10-CM

## 2017-06-17 MED ORDER — HYDROCODONE-ACETAMINOPHEN 5-325 MG PO TABS
1.0000 | ORAL_TABLET | Freq: Once | ORAL | Status: AC
  Administered 2017-06-17: 1 via ORAL

## 2017-06-17 MED ORDER — ONDANSETRON 8 MG PO TBDP
8.0000 mg | ORAL_TABLET | Freq: Once | ORAL | Status: AC
  Administered 2017-06-17: 8 mg via ORAL

## 2017-06-17 MED ORDER — IOPAMIDOL (ISOVUE-M 200) INJECTION 41%
1.0000 mL | Freq: Once | INTRAMUSCULAR | Status: AC
  Administered 2017-06-17: 1 mL via EPIDURAL

## 2017-06-17 NOTE — Telephone Encounter (Signed)
Yes, I would set her up for blood patch

## 2017-06-17 NOTE — Telephone Encounter (Signed)
New Message  Pt has had headache all weekend and it has gotten worse since her appt on Friday [facility].

## 2017-06-17 NOTE — Telephone Encounter (Signed)
Called [facility], [facility] with [facility], put in orders for blood patch per Roberta's instructions. She is to contact Pt.

## 2017-06-17 NOTE — Discharge Instructions (Signed)

## 2017-06-17 NOTE — Progress Notes (Signed)
Discharge instructions explained to pt by interpreter. Pt states her nausea is improved will give pain meds now.

## 2017-06-17 NOTE — Telephone Encounter (Signed)
Rcvd call [facility]. She rcvd 4 vials of CSF but no orders, faxed orders to her at [phone]

## 2017-06-17 NOTE — Telephone Encounter (Signed)
Dr Georgeanna HarrisonJaffe-Do you believe a blood patch would be helpful?

## 2017-06-17 NOTE — Progress Notes (Signed)
Blood drawn from left AC, site unremarkable and 20cc's of blood obtained

## 2017-06-18 ENCOUNTER — Other Ambulatory Visit: Payer: Self-pay

## 2017-06-18 ENCOUNTER — Telehealth: Payer: Self-pay

## 2017-06-18 DIAGNOSIS — Z79899 Other long term (current) drug therapy: Secondary | ICD-10-CM

## 2017-06-18 MED ORDER — ACETAZOLAMIDE ER 500 MG PO CP12: 500.0000 mg | ORAL_CAPSULE | Freq: Two times a day (BID) | ORAL | 3 refills | Status: DC

## 2017-06-18 NOTE — Telephone Encounter (Signed)
Spoke with Karen Gamble yesterday 06/17/17, [facility] was [facility], advsd her of medication and labs as well as need to see optometrist and f/u with us. Karen Gamble verbalized understanding

## 2017-06-18 NOTE — Telephone Encounter (Signed)
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  I would like to start acetazolamide ER 500mg  twice daily.  It will help bring down the pressure in her head.  Side effects include numbness and tingling, so she shouldn't be worried if she experiences this.  I want to check a baseline BMP.  She should start the acetazolamide immediately and then follow up with her optometrist, Dr. Herschel SenegalZachary Nobles, in 2 months.  She should follow up with me in 3 months.

## 2017-06-19 ENCOUNTER — Other Ambulatory Visit: Payer: Self-pay

## 2017-06-19 DIAGNOSIS — Z79899 Other long term (current) drug therapy: Secondary | ICD-10-CM

## 2017-06-19 LAB — BASIC METABOLIC PANEL
BUN: 12 mg/dL (ref 6–23)
CHLORIDE: 104 meq/L (ref 96–112)
CO2: 24 meq/L (ref 19–32)
Calcium: 10 mg/dL (ref 8.4–10.5)
Creatinine, Ser: 0.73 mg/dL (ref 0.40–1.20)
GFR: 98.57 mL/min (ref >60.00)
GLUCOSE: 102 mg/dL — AB (ref 70–99)
POTASSIUM: 4.4 meq/L (ref 3.5–5.1)
Sodium: 136 mEq/L (ref 135–145)

## 2017-06-21 LAB — CSF CELL COUNT WITH DIFFERENTIAL
RBC COUNT CSF: 0 {cells}/uL (ref 0–10)
WBC CSF: 0 {cells}/uL (ref 0–5)

## 2017-06-21 LAB — CSF CULTURE W GRAM STAIN: Result:: NO GROWTH

## 2017-06-21 LAB — CSF CULTURE
MICRO NUMBER: 81177716
SPECIMEN QUALITY: ADEQUATE

## 2017-06-21 LAB — GLUCOSE, CSF: Glucose, CSF: 60 mg/dL (ref 40–80)

## 2017-06-21 LAB — PROTEIN, CSF: Total Protein, CSF: 40 mg/dL (ref 15–45)

## 2017-06-24 ENCOUNTER — Encounter: Payer: Self-pay | Admitting: Neurology

## 2017-06-24 ENCOUNTER — Ambulatory Visit (INDEPENDENT_AMBULATORY_CARE_PROVIDER_SITE_OTHER): Payer: Self-pay | Admitting: Neurology

## 2017-06-24 VITALS — BP 108/66 | HR 82 | Ht 60.0 in | Wt 148.0 lb

## 2017-06-24 DIAGNOSIS — F329 Major depressive disorder, single episode, unspecified: Secondary | ICD-10-CM

## 2017-06-24 DIAGNOSIS — G932 Benign intracranial hypertension: Secondary | ICD-10-CM

## 2017-06-24 DIAGNOSIS — F32A Depression, unspecified: Secondary | ICD-10-CM

## 2017-06-24 DIAGNOSIS — R413 Other amnesia: Secondary | ICD-10-CM

## 2017-06-24 DIAGNOSIS — M5417 Radiculopathy, lumbosacral region: Secondary | ICD-10-CM

## 2017-06-24 NOTE — Progress Notes (Signed)
NEUROLOGY FOLLOW UP OFFICE NOTE  Karen Gamble [id number]  HISTORY OF PRESENT ILLNESS: Karen Gamble is a 31 year old Spanish-speaking female who follows up for right sided headache and papilledema.  She is accompanied by a friend who helps supplement history.  A Spanish-speaking interpretor is present to translate.  UPDATE: MRI of brain with and without contrast and MRA from 11/15/16 were unremarkable.  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  She deferred lumbar puncture for the time being but later had it performed on 06/14/17.  Opening pressure was 23 cm H2O.  CSF analysis was unremarkable with cell count 0, protein 40, glucose 60 and negative culture.  She subsequently developed a post-LP headache, requiring a blood patch.  Headaches are improved.  She has a dull right sided headache when she bends over.  Since the procedure, she has had a recurrence of left sided sciatic pain.  Of note, she reports memory difficulties.  She does not recall certain events such as a particular party or visit to a park.  Several times a week, she forgets why she walked into a room.  She does report some stress.  Her mother recently passed away.  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  She often doesn't finish cleaning until late at night.    06/19/17 BMP:  Na 136, K 4.4, Cl 104, CO2 24, glucose 102, BUN 12, Cr 0.73.   HISTORY: She has had a headache since 2016.  It is right sided 5/10 pressure-like headache radiating from the back of her head to the front and behind the left eye.  She does have some neck pain.  It lasts all day and occurs 2 days a week.  Triggers include chocolate.  She usually takes Tylenol, which helps as well.  She sometimes has dizziness.  The headache is not positional.  She denies nausea, photophobia, phonophobia, visual obscurations, pulsatile tinnitus,  unilateral weakness or numbness.   Sometimes, she sees a little black spot in the vision of her right eye that moves around, "like a spider".  It occurs with and without the headache.  She notices it if she is looking at something white.  It does not always occur but she notices it daily.   As per the optometrist's referral note, she had imaging to rule out an ocular foreign body, which was reportedly negative.  On the optometrist's exam, she was found to have right optic nerve head elevation and possible mild optic nerve edema.     Of note, she sometimes has episodes where she will suddenly drop objects from her right hand.  It occurs suddenly.  It feels like her arm is overworked but she had not performed any strenuous exercise.  There is no associated pain, numbness or weakness.  It occurs rarely.  PAST MEDICAL HISTORY: Past Medical History:  Diagnosis Date  . Allergy   . Gestational diabetes    2nd pregnancy  . No pertinent past medical history   . Seasonal allergies     MEDICATIONS: Current Outpatient Prescriptions on File Prior to Visit  Medication Sig Dispense Refill  . acetaZOLAMIDE (DIAMOX) 500 MG capsule Take 1 capsule (500 mg total) by mouth 2 (two) times daily. 60 capsule 3  . cyclobenzaprine (FLEXERIL) 5 MG tablet 1 pill by mouth up to every 8 hours as needed. Start with one pill by mouth each bedtime  as needed due to sedation 15 tablet 0  . fexofenadine (ALLEGRA) 180 MG tablet Take 180 mg by mouth daily.    . meloxicam (MOBIC) 7.5 MG tablet Take 1 tablet (7.5 mg total) by mouth daily. 30 tablet 0  . vitamin E 400 UNIT capsule Take 400 Units by mouth daily. 2 caps daily     No current facility-administered medications on file prior to visit.     ALLERGIES: No Known Allergies  FAMILY HISTORY: Family History  Problem Relation Age of Onset  . Diabetes Sister   . Diabetes Sister   . Diabetes Sister   . Diabetes Sister     SOCIAL HISTORY: Social History   Social  History  . Marital status: Married    Spouse name: N/A  . Number of children: N/A  . Years of education: N/A   Occupational History  . Not on file.   Social History Main Topics  . Smoking status: Never Smoker  . Smokeless tobacco: Never Used  . Alcohol use Yes     Comment: occassionally  . Drug use: No  . Sexual activity: Yes    Birth control/ protection: Condom   Other Topics Concern  . Not on file   Social History Narrative  . No narrative on file    REVIEW OF SYSTEMS: Constitutional: No fevers, chills, or sweats, no generalized fatigue, change in appetite Eyes: No visual changes, double vision, eye pain Ear, nose and throat: No hearing loss, ear pain, nasal congestion, sore throat Cardiovascular: No chest pain, palpitations Respiratory:  No shortness of breath at rest or with exertion, wheezes GastrointestinaI: No nausea, vomiting, diarrhea, abdominal pain, fecal incontinence Genitourinary:  No dysuria, urinary retention or frequency Musculoskeletal:  No neck pain, back pain Integumentary: No rash, pruritus, skin lesions Neurological: as above Psychiatric: No depression, insomnia, anxiety Endocrine: No palpitations, fatigue, diaphoresis, mood swings, change in appetite, change in weight, increased thirst Hematologic/Lymphatic:  No purpura, petechiae. Allergic/Immunologic: no itchy/runny eyes, nasal congestion, recent allergic reactions, rashes  PHYSICAL EXAM: Vitals:   06/24/17 1219  BP: 108/66  Pulse: 82  SpO2: 99%   General: No acute distress.  Patient appears well-groomed.   Head:  Normocephalic/atraumatic Eyes:  Fundi examined but not visualized Neck: supple, no paraspinal tenderness, full range of motion Heart:  Regular rate and rhythm Lungs:  Clear to auscultation bilaterally Back: No paraspinal tenderness Neurological Exam: alert and oriented to person, place, and time. Attention span and concentration intact, recent and remote memory intact, fund of  knowledge intact.  Speech fluent and not dysarthric, language intact.  CN II-XII intact. Bulk and tone normal, muscle strength 5/5 throughout.  Sensation to light touch  intact.  Deep tendon reflexes 2+ throughout.  Finger to nose testing intact.  Gait normal  IMPRESSION: Idiopathic intracranial hypertension Left sided lumbosacral radiculopathy/sciatica Memory deficits, likely secondary to depression or anxiety  PLAN: 1.  She will continue acetazolamide ER 500mg  twice daily 2.  I want her to follow up with her optometrist, Dr. Francesca OmanNobles, in 3 months and then follow up with me soon afterward.  His office note should be sent to me. 3.  Aleve as needed for sciatic pain. 4.  Advised that she should take one hour out per day to do something she enjoys.  25 minutes spent face to face with patient, over 50% spent discussing management.  Shon MilletAdam Marsella Suman, DO

## 2017-06-24 NOTE — Patient Instructions (Signed)
1.   Continue acetazolamide ER 500mg  twice daily 2.  Follow up with the eye doctor, Dr. Francesca OmanNobles, in 3 months and then with me right afterwards.  Ask him to send his report to me. 3.  Take Aleve as needed for the sciatic pain 4.  Take time out for one hour everyday to do something that you enjoy

## 2017-07-09 ENCOUNTER — Ambulatory Visit: Payer: Self-pay | Admitting: Neurology

## 2017-09-27 LAB — HM DIABETES EYE EXAM

## 2017-10-08 ENCOUNTER — Ambulatory Visit: Payer: Self-pay | Admitting: Neurology

## 2017-10-08 ENCOUNTER — Encounter: Payer: Self-pay | Admitting: Neurology

## 2017-10-08 VITALS — BP 98/62 | HR 70 | Ht 60.0 in | Wt 138.0 lb

## 2017-10-08 DIAGNOSIS — G932 Benign intracranial hypertension: Secondary | ICD-10-CM

## 2017-10-08 DIAGNOSIS — M542 Cervicalgia: Secondary | ICD-10-CM

## 2017-10-08 DIAGNOSIS — G43009 Migraine without aura, not intractable, without status migrainosus: Secondary | ICD-10-CM

## 2017-10-08 DIAGNOSIS — G44219 Episodic tension-type headache, not intractable: Secondary | ICD-10-CM

## 2017-10-08 MED ORDER — TIZANIDINE HCL 2 MG PO TABS: 2.0000 mg | ORAL_TABLET | Freq: Every evening | ORAL | 2 refills | Status: DC | PRN

## 2017-10-08 MED ORDER — ACETAZOLAMIDE ER 500 MG PO CP12: 500.0000 mg | ORAL_CAPSULE | Freq: Two times a day (BID) | ORAL | 2 refills | Status: DC

## 2017-10-08 NOTE — Progress Notes (Signed)
NEUROLOGY FOLLOW UP OFFICE NOTE  Karen Gamble [id number]  HISTORY OF PRESENT ILLNESS: Karen Gamble is a 32 year old Spanish-speaking female who follows up for idiopathic intracranial hypertension.  She is accompanied by a friend who helps supplement history.  A Spanish-speaking interpretor is present to translate.   UPDATE: She followed up with her optometrist, Dr. Francesca Oman, on 09/27/17.  Exam revealed only very slight optic disc elevation without edema.  Headaches are improved.  Mostly, she notes occipital pain radiating down the neck, mostly on the right side.  It is moderate intensity and lasts all day.  It occurs about once a week.  Stress is a trigger.  Since starting acetazolamide, she has had some hair loss.  Current medication:  acetazolamide ER 500mg  twice daily   HISTORY: She has had a headache since 2016.  It is right sided 5/10 pressure-like headache radiating from the back of her head to the front and behind the left eye.  She does have some neck pain.  It lasts all day and occurs 2 days a week.  Triggers include chocolate.  She usually takes Tylenol, which helps as well.  She sometimes has dizziness.  The headache is not positional.  She denies nausea, photophobia, phonophobia, visual obscurations, pulsatile tinnitus, unilateral weakness or numbness.   Sometimes, she sees a little black spot in the vision of her right eye that moves around, "like a spider".  It occurs with and without the headache.  She notices it if she is looking at something white.  It does not always occur but she notices it daily.   As per the optometrist's referral note, she had imaging to rule out an ocular foreign body, which was reportedly negative.  On the optometrist's exam, she was found to have right optic nerve head elevation and possible mild optic nerve edema.     She reports other neurologic symptoms as well.  She sometimes has episodes where she will suddenly drop objects from  her right hand.  It occurs suddenly.  It feels like her arm is overworked but she had not performed any strenuous exercise.  There is no associated pain, numbness or weakness.  It occurs rarely.  She also reports memory difficulties.  She does not recall certain events such as a particular party or visit to a park.  Several times a week, she forgets why she walked into a room.  She does report some stress.  Her mother recently passed away.  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  She often doesn't finish cleaning until late at night.    MRI of brain with and without contrast and MRA from 11/15/16 were unremarkable.  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  She deferred lumbar puncture for the time being but later had it performed on 06/14/17.  Opening pressure was 23 cm H2O.  CSF analysis was unremarkable with cell count 0, protein 40, glucose 60 and negative culture.  She subsequently developed a post-LP headache, requiring a blood patch.  Headaches are improved.  She has a dull right sided headache when she bends over.  Since the procedure, she has had a recurrence of left sided sciatic pain.  PAST MEDICAL HISTORY: Past Medical History:  Diagnosis Date  . Allergy   . Gestational diabetes    2nd pregnancy  . No pertinent past medical history   . Seasonal  allergies     MEDICATIONS: Current Outpatient Medications on File Prior to Visit  Medication Sig Dispense Refill  . cyclobenzaprine (FLEXERIL) 5 MG tablet 1 pill by mouth up to every 8 hours as needed. Start with one pill by mouth each bedtime as needed due to sedation 15 tablet 0  . fexofenadine (ALLEGRA) 180 MG tablet Take 180 mg by mouth daily.    . meloxicam (MOBIC) 7.5 MG tablet Take 1 tablet (7.5 mg total) by mouth daily. 30 tablet 0  . vitamin E 400 UNIT capsule Take 400 Units by mouth daily. 2 caps daily     No current  facility-administered medications on file prior to visit.     ALLERGIES: No Known Allergies  FAMILY HISTORY: Family History  Problem Relation Age of Onset  . Diabetes Sister   . Diabetes Sister   . Diabetes Sister   . Diabetes Sister     SOCIAL HISTORY: Social History   Socioeconomic History  . Marital status: Married    Spouse name: Not on file  . Number of children: Not on file  . Years of education: Not on file  . Highest education level: Not on file  Social Needs  . Financial resource strain: Not on file  . Food insecurity - worry: Not on file  . Food insecurity - inability: Not on file  . Transportation needs - medical: Not on file  . Transportation needs - non-medical: Not on file  Occupational History  . Not on file  Tobacco Use  . Smoking status: Never Smoker  . Smokeless tobacco: Never Used  Substance and Sexual Activity  . Alcohol use: Yes    Comment: occassionally  . Drug use: No  . Sexual activity: Yes    Birth control/protection: Condom  Other Topics Concern  . Not on file  Social History Narrative  . Not on file    REVIEW OF SYSTEMS: Constitutional: No fevers, chills, or sweats, no generalized fatigue, change in appetite Eyes: No visual changes, double vision, eye pain Ear, nose and throat: No hearing loss, ear pain, nasal congestion, sore throat Cardiovascular: No chest pain, palpitations Respiratory:  No shortness of breath at rest or with exertion, wheezes GastrointestinaI: No nausea, vomiting, diarrhea, abdominal pain, fecal incontinence Genitourinary:  No dysuria, urinary retention or frequency Musculoskeletal:  Neck pain Integumentary: Hair loss.  No rash, pruritus, skin lesions Neurological: as above Psychiatric: No depression, insomnia, anxiety Endocrine: No palpitations, fatigue, diaphoresis, mood swings, change in appetite, change in weight, increased thirst Hematologic/Lymphatic:  No purpura, petechiae. Allergic/Immunologic: no  itchy/runny eyes, nasal congestion, recent allergic reactions, rashes  PHYSICAL EXAM: Vitals:   10/08/17 0904  BP: 98/62  Pulse: 70  SpO2: 99%   General: No acute distress.  Patient appears well-groomed.   Head:  Normocephalic/atraumatic Eyes:  Fundi examined but not visualized Neck: supple, mild paraspinal tenderness, full range of motion Heart:  Regular rate and rhythm Lungs:  Clear to auscultation bilaterally Back: No paraspinal tenderness Neurological Exam: alert and oriented to person, place, and time. Attention span and concentration intact, recent and remote memory intact, fund of knowledge intact.  Speech fluent and not dysarthric, language intact.  CN II-XII intact. Bulk and tone normal, muscle strength 5/5 throughout.  Sensation to light touch  intact.  Deep tendon reflexes 2+ throughout.  Finger to nose testing intact.  Gait normal  IMPRESSION: Idiopathic intracranial hypertension, improved Cervicalgia, tension-type headache Migraine without aura  PLAN: 1.  Continue acetazolamide 500mg  twice daily for  now.  I will have her rechecked by Dr. Iran Sizer in another 2 months.  If still doing well, I will taper her off the acetazolamide. 2.  Tizandine 2mg  at bedtime as needed for neck pain 3.  Follow up in 3 months.  Shon Millet, DO

## 2017-10-08 NOTE — Patient Instructions (Signed)
1.  Continue acetazolamide 500mg  twice daily for now. 2.  Follow up with the eye doctor again in 2 months. 3.  Then follow up with me (in about 3 months).  If eyes still look good, we will stop the medication 4.  For neck pain, take tizanidine 2mg  at bedtime as needed.

## 2017-10-08 NOTE — Progress Notes (Signed)
Called and spoke with Delia, Dr Everlena CooperJaffe is requesting eye dr notes. Delia states she no longer interprets for Pt and to remove her name and number.

## 2018-01-09 ENCOUNTER — Encounter: Payer: Self-pay | Admitting: *Deleted

## 2018-01-13 ENCOUNTER — Telehealth: Payer: Self-pay

## 2018-01-13 NOTE — Telephone Encounter (Signed)
Rcvd call from [facility] at Dr Francesca Oman [phone]) office, they need last OV note. Pt is there now. Faxing to [phone]

## 2018-01-15 ENCOUNTER — Encounter: Payer: Self-pay | Admitting: Neurology

## 2018-01-15 ENCOUNTER — Other Ambulatory Visit: Payer: Self-pay

## 2018-01-15 ENCOUNTER — Ambulatory Visit: Payer: Self-pay | Admitting: Neurology

## 2018-01-15 VITALS — BP 110/68 | HR 64 | Ht 60.0 in | Wt 137.0 lb

## 2018-01-15 DIAGNOSIS — G932 Benign intracranial hypertension: Secondary | ICD-10-CM

## 2018-01-15 NOTE — Progress Notes (Signed)
NEUROLOGY FOLLOW UP OFFICE NOTE  Karen Gamble [id number]  HISTORY OF PRESENT ILLNESS: Karen Gamble is a 32 year old Spanish-speaking female who follows up for idiopathic intracranial hypertension.  A Spanish-speaking interpretor is present to translate.   UPDATE: She followed up with her optometrist, Dr. Francesca Oman, on 01/13/18.  Exam revealed very mild elevation of the optic disc but no edema.  She denies headache or visual disturbance.  She would like to stop the acetazolamide because it makes her tired and she experiences paresthesias.  She also did not have a good experience with the optometrist on Monday and would rather not return.  For neck pain, she was taking tizanidine  at bedtime.  Neck pain has resolved.   Current medication:  acetazolamide ER  twice daily   HISTORY: She has had a headache since 2016.  It is right sided 5/10 pressure-like headache radiating from the back of her head to the front and behind the left eye.  She does have some neck pain.  It lasts all day and occurs 2 days a week.  Triggers include chocolate.  She usually takes Tylenol, which helps as well.  She sometimes has dizziness.  The headache is not positional.  She denies nausea, photophobia, phonophobia, visual obscurations, pulsatile tinnitus, unilateral weakness or numbness.   Sometimes, she sees a little black spot in the vision of her right eye that moves around, "like a spider".  It occurs with and without the headache.  She notices it if she is looking at something white.  It does not always occur but she notices it daily.   As per the optometrist's referral note, she had imaging to rule out an ocular foreign body, which was reportedly negative.  On the optometrist's exam, she was found to have right optic nerve head elevation and possible mild optic nerve edema.     She reports other neurologic symptoms as well.  She sometimes has episodes where she will suddenly drop objects  from her right hand.  It occurs suddenly.  It feels like her arm is overworked but she had not performed any strenuous exercise.  There is no associated pain, numbness or weakness.  It occurs rarely.  She also reports memory difficulties.  She does not recall certain events such as a particular party or visit to a park.  Several times a week, she forgets why she walked into a room.  She does report some stress.  Her mother recently passed away.  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  She often doesn't finish cleaning until late at night.     MRI of brain with and without contrast and MRA from 11/15/16 were unremarkable.  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  She deferred lumbar puncture for the time being but later had it performed on 06/14/17.  Opening pressure was 23 cm H2O.  CSF analysis was unremarkable with cell count 0, protein 40, glucose 60 and negative culture.  She subsequently developed a post-LP headache, requiring a blood patch.  Headaches are improved.  She has a dull right sided headache when she bends over.  Since the procedure, she has had a recurrence of left sided sciatic pain.  PAST MEDICAL HISTORY: Past Medical History:  Diagnosis Date  . Allergy   . Gestational diabetes    2nd pregnancy  . No pertinent past medical history   .  Seasonal allergies     MEDICATIONS: Current Outpatient Medications on File Prior to Visit  Medication Sig Dispense Refill  . cyclobenzaprine (FLEXERIL) 5 MG tablet 1 pill by mouth up to every 8 hours as needed. Start with one pill by mouth each bedtime as needed due to sedation 15 tablet 0  . fexofenadine (ALLEGRA) 180 MG tablet Take 180 mg by mouth daily.    . meloxicam (MOBIC) 7.5 MG tablet Take 1 tablet (7.5 mg total) by mouth daily. 30 tablet 0  . tiZANidine (ZANAFLEX) 2 MG tablet Take 1 tablet (2 mg total) by mouth at bedtime as needed  for muscle spasms. 30 tablet 2  . vitamin E 400 UNIT capsule Take 400 Units by mouth daily. 2 caps daily     No current facility-administered medications on file prior to visit.     ALLERGIES: No Known Allergies  FAMILY HISTORY: Family History  Problem Relation Age of Onset  . Diabetes Sister   . Diabetes Sister   . Diabetes Sister   . Diabetes Sister     SOCIAL HISTORY: Social History   Socioeconomic History  . Marital status: Married    Spouse name: Not on file  . Number of children: Not on file  . Years of education: Not on file  . Highest education level: Not on file  Occupational History  . Not on file  Social Needs  . Financial resource strain: Not on file  . Food insecurity:    Worry: Not on file    Inability: Not on file  . Transportation needs:    Medical: Not on file    Non-medical: Not on file  Tobacco Use  . Smoking status: Never Smoker  . Smokeless tobacco: Never Used  Substance and Sexual Activity  . Alcohol use: Yes    Comment: occassionally  . Drug use: No  . Sexual activity: Yes    Birth control/protection: Condom  Lifestyle  . Physical activity:    Days per week: Not on file    Minutes per session: Not on file  . Stress: Not on file  Relationships  . Social connections:    Talks on phone: Not on file    Gets together: Not on file    Attends religious service: Not on file    Active member of club or organization: Not on file    Attends meetings of clubs or organizations: Not on file    Relationship status: Not on file  . Intimate partner violence:    Fear of current or ex partner: Not on file    Emotionally abused: Not on file    Physically abused: Not on file    Forced sexual activity: Not on file  Other Topics Concern  . Not on file  Social History Narrative  . Not on file    REVIEW OF SYSTEMS: Constitutional: No fevers, chills, or sweats, no generalized fatigue, change in appetite Eyes: No visual changes, double vision, eye  pain Ear, nose and throat: No hearing loss, ear pain, nasal congestion, sore throat Cardiovascular: No chest pain, palpitations Respiratory:  No shortness of breath at rest or with exertion, wheezes GastrointestinaI: No nausea, vomiting, diarrhea, abdominal pain, fecal incontinence Genitourinary:  No dysuria, urinary retention or frequency Musculoskeletal:  No neck pain, back pain Integumentary: No rash, pruritus, skin lesions Neurological: as above Psychiatric: No depression, insomnia, anxiety Endocrine: No palpitations, fatigue, diaphoresis, mood swings, change in appetite, change in weight, increased thirst Hematologic/Lymphatic:  No purpura, petechiae.  Allergic/Immunologic: no itchy/runny eyes, nasal congestion, recent allergic reactions, rashes  PHYSICAL EXAM: Vitals:   01/15/18 0933  BP: 110/68  Pulse: 64  SpO2: 99%   General: No acute distress.  Patient appears well-groomed.   Head:  Normocephalic/atraumatic Eyes:  Fundi examined but not visualized Neck: supple, no paraspinal tenderness, full range of motion Heart:  Regular rate and rhythm Lungs:  Clear to auscultation bilaterally Back: No paraspinal tenderness Neurological Exam: alert and oriented to person, place, and time. Attention span and concentration intact, recent and remote memory intact, fund of knowledge intact.  Speech fluent and not dysarthric, language intact.  CN II-XII intact. Bulk and tone normal, muscle strength 5/5 throughout.  Sensation to light touch  intact.  Deep tendon reflexes 2+ throughout.  Finger to nose testing intact.  Gait normal, Romberg negative.  IMPRESSION: Idiopathic intracranial hypertension  PLAN: 1.  She wishes to discontinue acetazolamide.  Since she no longer exhibit papilledema, she will stop.  However, I want her to have her eyes re-examined in 3 months to evaluate for any recurrence of papilledema.  We will refer her to another optometrist.  She will follow up with me after the  recheck. 2.  If she starts experiencing new headaches, visual obscurations or pulsatile tinnitus, she was instructed to contact me.  25 minutes spent face to face with patient, over 50% spent discussing management.  Shon Millet, DO

## 2018-01-15 NOTE — Patient Instructions (Signed)
1.  Stop the acetazolamide. 2.  I want you to have your eyes checked again in 3 months.  We will refer you to somebody else. 3.  I want you to follow up with me again right after the recheck.

## 2018-01-21 ENCOUNTER — Encounter: Payer: Self-pay | Admitting: Emergency Medicine

## 2018-01-21 ENCOUNTER — Ambulatory Visit: Payer: Self-pay | Admitting: Emergency Medicine

## 2018-01-21 ENCOUNTER — Other Ambulatory Visit: Payer: Self-pay

## 2018-01-21 VITALS — BP 100/60 | HR 63 | Temp 98.2°F | Resp 16 | Ht 59.5 in | Wt 144.6 lb

## 2018-01-21 DIAGNOSIS — Z8669 Personal history of other diseases of the nervous system and sense organs: Secondary | ICD-10-CM

## 2018-01-21 DIAGNOSIS — R233 Spontaneous ecchymoses: Secondary | ICD-10-CM

## 2018-01-21 DIAGNOSIS — R1084 Generalized abdominal pain: Secondary | ICD-10-CM

## 2018-01-21 DIAGNOSIS — R600 Localized edema: Secondary | ICD-10-CM

## 2018-01-21 LAB — POCT URINALYSIS DIP (MANUAL ENTRY)
Bilirubin, UA: NEGATIVE
GLUCOSE UA: NEGATIVE mg/dL
Ketones, POC UA: NEGATIVE mg/dL
Leukocytes, UA: NEGATIVE
NITRITE UA: NEGATIVE
PROTEIN UA: NEGATIVE mg/dL
SPEC GRAV UA: 1.025 (ref 1.010–1.025)
Urobilinogen, UA: 0.2 E.U./dL
pH, UA: 6.5 (ref 5.0–8.0)

## 2018-01-21 LAB — POCT URINE PREGNANCY: Preg Test, Ur: NEGATIVE

## 2018-01-21 NOTE — Patient Instructions (Addendum)
     IF you received an x-ray today, you will receive an invoice from [facility]. Please contact [facility] at [phone] with questions or concerns regarding your invoice.   IF you received labwork today, you will receive an invoice from [facility]. Please contact [facility] at [phone] with questions or concerns regarding your invoice.   Our billing staff will not be able to assist you with questions regarding bills from these companies.  You will be contacted with the lab results as soon as they are available. The fastest way to get your results is to activate your My Chart account. Instructions are located on the last page of this paperwork. If you have not heard from us regarding the results in 2 weeks, please contact this office.     Dolor abdominal en los adultos Abdominal Pain, Adult El dolor de estmago (abdominal) puede tener muchas causas. La mayora de las veces, el dolor de estmago no es peligroso. Muchos de estos casos de dolor de estmago pueden controlarse y tratarse en casa. Sin embargo, a veces, el dolor de estmago puede ser grave. El mdico intentar descubrir la causa del dolor de estmago. Siga estas indicaciones en su casa:  Tome los medicamentos de venta libre y los recetados solamente como se lo haya indicado el mdico. No tome medicamentos que lo ayuden a defecar (laxantes), salvo que el mdico se lo indique.  Beba suficiente lquido para mantener el pis (orina) claro o de color amarillo plido.  Est atento al dolor de estmago para detectar cualquier cambio.  Concurra a todas las visitas de control como se lo haya indicado el mdico. Esto es importante. Comunquese con un mdico si:  El dolor de estmago cambia o empeora.  No tiene apetito o baja de peso sin proponrselo.  Tiene dificultades para defecar (est estreido) o heces lquidas (diarrea) durante ms de 2 o 3das.  Siente dolor al orinar o defecar.  El dolor de estmago  lo despierta de noche.  El dolor empeora con las comidas, despus de comer o con determinados alimentos.  Vomita y no puede retener nada de lo que ingiere.  Tiene fiebre. Solicite ayuda de inmediato si:  El dolor no desaparece en el tiempo indicado por el mdico.  No puede detener los vmitos.  Siente dolor solamente en zonas especficas del abdomen, como el lado derecho o la parte inferior izquierda.  Tiene heces con sangre, de color negro o con aspecto alquitranado.  Tiene dolor muy intenso en el vientre, clicos o meteorismo.  Presenta signos de no tener suficientes lquidos o agua en el cuerpo (deshidratacin), por ejemplo: ? La orina es oscura, es muy escasa o no orina. ? Labios agrietados. ? [place]. ? Ojos hundidos. ? Somnolencia. ? Debilidad. Esta informacin no tiene como fin reemplazar el consejo del mdico. Asegrese de hacerle al mdico cualquier pregunta que tenga. Document Released: 11/09/2008 Document Revised: 11/07/2016 Document Reviewed: 01/25/2016 Elsevier Interactive Patient Education  2018 Elsevier Inc.  

## 2018-01-21 NOTE — Progress Notes (Signed)
Karen Gamble 32 y.o.   Chief Complaint  Patient presents with  . Edema    face,hands,knees and feet x 2 days with bruises and does not know why  . Abdominal Pain    started yesterday    HISTORY OF PRESENT ILLNESS: This is a 32 y.o. female with a history of idiopathic intracranial hypertension recently stopped Diamox after taking it for close to 7 months.  Complaining of swelling of both hands and feet that started yesterday.  Also noticed swelling around her eyes.  Also complaining of generalized abdominal pain and some spontaneous bruises to right outer thigh with no injury.  HPI   Prior to Admission medications   Medication Sig Start Date End Date Taking? Authorizing Provider  cyclobenzaprine (FLEXERIL) 5 MG tablet 1 pill by mouth up to every 8 hours as needed. Start with one pill by mouth each bedtime as needed due to sedation Patient not taking: Reported on 01/21/2018 03/29/16   Shade Flood, MD  fexofenadine (ALLEGRA) 180 MG tablet Take 180 mg by mouth daily.    [provider]  meloxicam (MOBIC) 7.5 MG tablet Take 1 tablet (7.5 mg total) by mouth daily. Patient not taking: Reported on 01/21/2018 03/29/16   Shade Flood, MD  tiZANidine (ZANAFLEX) 2 MG tablet Take 1 tablet (2 mg total) by mouth at bedtime as needed for muscle spasms. Patient not taking: Reported on 01/21/2018 10/08/17   Drema Dallas, DO  vitamin E 400 UNIT capsule Take 400 Units by mouth daily. 2 caps daily    [provider]    No Known Allergies  Patient Active Problem List   Diagnosis Date Noted  . Right flank pain 04/28/2015  . Dyspepsia 04/28/2015  . Atypical chest pain 04/28/2015  . Gestational diabetes mellitus, antepartum 12/13/2011  . Pregnancy, supervision, high-risk 09/12/2011  . History of gestational diabetes in prior pregnancy, currently pregnant 09/12/2011  . Cataract of left eye 07/18/2011    Past Medical History:  Diagnosis Date  . Allergy   .  Gestational diabetes    2nd pregnancy  . No pertinent past medical history   . Seasonal allergies     Past Surgical History:  Procedure Laterality Date  . CHOLECYSTECTOMY      Social History   Socioeconomic History  . Marital status: Married    Spouse name: Not on file  . Number of children: Not on file  . Years of education: Not on file  . Highest education level: Not on file  Occupational History  . Not on file  Social Needs  . Financial resource strain: Not on file  . Food insecurity:    Worry: Not on file    Inability: Not on file  . Transportation needs:    Medical: Not on file    Non-medical: Not on file  Tobacco Use  . Smoking status: Never Smoker  . Smokeless tobacco: Never Used  Substance and Sexual Activity  . Alcohol use: Yes    Comment: occassionally  . Drug use: No  . Sexual activity: Yes    Birth control/protection: Condom  Lifestyle  . Physical activity:    Days per week: Not on file    Minutes per session: Not on file  . Stress: Not on file  Relationships  . Social connections:    Talks on phone: Not on file    Gets together: Not on file    Attends religious service: Not on file    Active member  of club or organization: Not on file    Attends meetings of clubs or organizations: Not on file    Relationship status: Not on file  . Intimate partner violence:    Fear of current or ex partner: Not on file    Emotionally abused: Not on file    Physically abused: Not on file    Forced sexual activity: Not on file  Other Topics Concern  . Not on file  Social History Narrative  . Not on file    Family History  Problem Relation Age of Onset  . Diabetes Sister   . Diabetes Sister   . Diabetes Sister   . Diabetes Sister      Review of Systems  Constitutional: Negative.  Negative for chills, fever and weight loss.  HENT: Negative.  Negative for congestion, nosebleeds and sore throat.   Eyes: Negative.   Respiratory: Negative.  Negative for  cough and shortness of breath.   Cardiovascular: Positive for leg swelling. Negative for chest pain and palpitations.  Gastrointestinal: Positive for abdominal pain. Negative for blood in stool, diarrhea, melena, nausea and vomiting.  Genitourinary: Negative.  Negative for dysuria and hematuria.  Musculoskeletal: Negative.  Negative for back pain, joint pain, myalgias and neck pain.  Skin: Negative.  Negative for rash.  Neurological: Negative.  Negative for dizziness and headaches.  Endo/Heme/Allergies: Negative.   All other systems reviewed and are negative.   Vitals:   01/21/18 1046  BP: 100/60  Pulse: 63  Resp: 16  Temp: 98.2 F (36.8 C)  SpO2: 98%    Physical Exam  Constitutional: She is oriented to person, place, and time. She appears well-developed and well-nourished.  HENT:  Head: Normocephalic and atraumatic.  Right Ear: External ear normal.  Left Ear: External ear normal.  Nose: Nose normal.  Mouth/Throat: Oropharynx is clear and moist.  Eyes: Pupils are equal, round, and reactive to light. Conjunctivae and EOM are normal.  Fundoscopic exam:      The right eye shows no papilledema.       The left eye shows no papilledema.  Neck: Normal range of motion. Neck supple. No JVD present. No thyromegaly present.  Cardiovascular: Normal rate, regular rhythm and normal heart sounds.  Pulmonary/Chest: Effort normal and breath sounds normal.  Abdominal: Soft. Bowel sounds are normal. She exhibits no distension. There is no tenderness.  Musculoskeletal: Normal range of motion. She exhibits edema (feet and ankles).  Neurological: She is alert and oriented to person, place, and time. No sensory deficit. She exhibits normal muscle tone.  Skin: Skin is warm and dry. Capillary refill takes less than 2 seconds. No rash noted.  Positive bruising to right upper outer thigh.  Psychiatric: She has a normal mood and affect. Her behavior is normal.  Vitals reviewed.  Results for orders  placed or performed in visit on 01/21/18 (from the past 24 hour(s))  POCT urinalysis dipstick     Status: Abnormal   Collection Time: 01/21/18 11:36 AM  Result Value Ref Range   Color, UA yellow yellow   Clarity, UA clear clear   Glucose, UA negative negative mg/dL   Bilirubin, UA negative negative   Ketones, POC UA negative negative mg/dL   Spec Grav, UA 5.366 4.403 - 1.025   Blood, UA moderate (A) negative   pH, UA 6.5 5.0 - 8.0   Protein Ur, POC negative negative mg/dL   Urobilinogen, UA 0.2 0.2 or 1.0 E.U./dL   Nitrite, UA Negative Negative  Leukocytes, UA Negative Negative  POCT urine pregnancy     Status: None   Collection Time: 01/21/18 11:37 AM  Result Value Ref Range   Preg Test, Ur Negative Negative     ASSESSMENT & PLAN: Melonie was seen today for edema and abdominal pain.  Diagnoses and all orders for this visit:  Generalized abdominal pain -     POCT urinalysis dipstick -     POCT urine pregnancy -     CBC with Differential/Platelet -     Comprehensive metabolic panel -     Complement, total -     C1 Esterase Inhibitor  Lower leg edema  Spontaneous bruising -     CBC with Differential/Platelet -     Coagulation Panel -     Complement, total -     C1 Esterase Inhibitor  History of idiopathic intracranial hypertension     Patient Instructions       IF you received an x-ray today, you will receive an invoice from [facility]. Please contact [facility] at [phone] with questions or concerns regarding your invoice.   IF you received labwork today, you will receive an invoice from [facility]. Please contact [facility] at [phone] with questions or concerns regarding your invoice.   Our billing staff will not be able to assist you with questions regarding bills from these companies.  You will be contacted with the lab results as soon as they are available. The fastest way to get your results is to activate your My Chart  account. Instructions are located on the last page of this paperwork. If you have not heard from [place] regarding the results in 2 weeks, please contact this office.     Dolor abdominal en los adultos Abdominal Pain, Adult El dolor de [place] (abdominal) puede tener muchas causas. La mayora de las veces, el dolor de [place] no es peligroso. Muchos de [employer] de dolor de estmago pueden controlarse y tratarse en casa. Sin embargo, a Occupational psychologist, Chief Technology Officer de [employer] puede ser grave. El mdico intentar descubrir la causa del dolor de [place]. Siga estas indicaciones en su casa:  Tome los medicamentos de venta libre y los recetados solamente como se lo haya indicado el mdico. No tome medicamentos que lo ayuden a Advertising copywriter (laxantes), salvo que el mdico se lo indique.  Beba suficiente lquido para mantener el pis (orina) claro o de color amarillo plido.  Est atento al dolor de estmago para Insurance risk surveyor cambio.  Concurra a todas las visitas de control como se lo haya indicado el mdico. Esto es importante. Comunquese con un mdico si:  El dolor de [address] cambia o [place].  No tiene apetito o baja de peso sin proponrselo.  Tiene dificultades para defecar (est estreido) o heces lquidas (diarrea) durante ms de 2 o 3das.  Siente dolor al orinar o defecar.  El dolor de estmago lo despierta de noche.  El dolor empeora con las comidas, despus de comer o con determinados alimentos.  Vomita y no puede retener nada de lo que ingiere.  Tiene fiebre. Solicite ayuda de inmediato si:  El dolor no desaparece en el tiempo indicado por el mdico.  No puede detener los vmitos.  Siente dolor solamente en zonas especficas del abdomen, como el lado derecho o la parte inferior izquierda.  Tiene heces con sangre, de color negro o con aspecto alquitranado.  Tiene dolor muy intenso en el vientre, clicos o meteorismo.  Presenta signos de no tener suficientes lquidos  o agua en el  cuerpo (deshidratacin), por ejemplo: ? [place], es muy escasa o no orina. ? Labios agrietados. ? [employer]. ? Ojos hundidos. ? Somnolencia. ? Debilidad. Esta informacin no tiene Theme park manager el consejo del mdico. Asegrese de hacerle al mdico cualquier pregunta que tenga. Document Released: 11/09/2008 Document Revised: 11/07/2016 Document Reviewed: 01/25/2016 Elsevier Interactive Patient Education  2018 Elsevier Inc.      Edwina Barth, MD [facility]

## 2018-01-24 ENCOUNTER — Encounter: Payer: Self-pay | Admitting: *Deleted

## 2018-01-24 NOTE — Progress Notes (Signed)
Letter sent.

## 2018-01-27 LAB — CBC WITH DIFFERENTIAL/PLATELET
BASOS ABS: 0 10*3/uL (ref 0.0–0.2)
Basos: 0 %
EOS (ABSOLUTE): 0.2 10*3/uL (ref 0.0–0.4)
Eos: 3 %
HEMOGLOBIN: 11.5 g/dL (ref 11.1–15.9)
Hematocrit: 35.6 % (ref 34.0–46.6)
IMMATURE GRANS (ABS): 0 10*3/uL (ref 0.0–0.1)
Immature Granulocytes: 0 %
Lymphocytes Absolute: 2.2 10*3/uL (ref 0.7–3.1)
Lymphs: 37 %
MCH: 30.3 pg (ref 26.6–33.0)
MCHC: 32.3 g/dL (ref 31.5–35.7)
MCV: 94 fL (ref 79–97)
MONOCYTES: 7 %
Monocytes Absolute: 0.4 10*3/uL (ref 0.1–0.9)
NEUTROS ABS: 3.1 10*3/uL (ref 1.4–7.0)
Neutrophils: 53 %
Platelets: 281 10*3/uL (ref 150–450)
RBC: 3.79 x10E6/uL (ref 3.77–5.28)
RDW: 13.8 % (ref 12.3–15.4)
WBC: 5.9 10*3/uL (ref 3.4–10.8)

## 2018-01-27 LAB — COAGULATION PANEL
ANTICARDIOLIPIN IGM: 14 [MPL'U]/mL — AB (ref 0–12)
APTT PPP: 27.3 s (ref 22.9–30.2)
AT III AG PPP IMM-ACNC: 95 % (ref 72–124)
AntiThromb III Func: 110 % (ref 75–135)
Anticardiolipin IgA: <9 APL U/mL (ref 0–11)
Anticardiolipin IgG: <9 GPL U/mL (ref 0–14)
DPT CONFIRM RATIO: 1.03 ratio (ref 0.00–1.40)
DPT: 41.8 s (ref 0.0–55.0)
Dilute Viper Venom Time: 32.7 s (ref 0.0–47.0)
FACTOR V ACTIVITY: 107 % (ref 70–150)
HOMOCYSTEINE: 10.4 umol/L (ref 0.0–15.0)
INR: 0.9 (ref 0.9–1.1)
PROTEIN C ACTIVITY: 117 % (ref 73–180)
PT: 10.1 s (ref 9.6–11.5)
PTT LA: 34.7 s (ref 0.0–51.9)
Protein C Antigen: 110 % (ref 60–150)
Protein S Activity: 78 % (ref 63–140)
Protein S Ag, Free: 90 % (ref 57–157)
Protein S Ag, Total: 78 % (ref 60–150)
Thrombin Time: 17.7 s (ref 0.0–23.0)

## 2018-01-27 LAB — COMPREHENSIVE METABOLIC PANEL
A/G RATIO: 1.5 (ref 1.2–2.2)
ALK PHOS: 56 IU/L (ref 39–117)
ALT: 21 IU/L (ref 0–32)
AST: 19 IU/L (ref 0–40)
Albumin: 4 g/dL (ref 3.5–5.5)
BUN/Creatinine Ratio: 16 (ref 9–23)
BUN: 10 mg/dL (ref 6–20)
Bilirubin Total: <0.2 mg/dL (ref 0.0–1.2)
CO2: 20 mmol/L (ref 20–29)
Calcium: 8.8 mg/dL (ref 8.7–10.2)
Chloride: 106 mmol/L (ref 96–106)
Creatinine, Ser: 0.61 mg/dL (ref 0.57–1.00)
GFR calc Af Amer: 139 mL/min/{1.73_m2} (ref >59)
GFR, EST NON AFRICAN AMERICAN: 120 mL/min/{1.73_m2} (ref >59)
GLOBULIN, TOTAL: 2.6 g/dL (ref 1.5–4.5)
Glucose: 82 mg/dL (ref 65–99)
POTASSIUM: 4.1 mmol/L (ref 3.5–5.2)
SODIUM: 140 mmol/L (ref 134–144)
Total Protein: 6.6 g/dL (ref 6.0–8.5)

## 2018-01-27 LAB — COMPLEMENT, TOTAL: COMPL TOTAL (CH50): 55 U/mL (ref >41)

## 2018-01-27 LAB — C1 ESTERASE INHIBITOR: C1INH SerPl-mCnc: 26 mg/dL (ref 21–39)

## 2018-01-30 ENCOUNTER — Encounter: Payer: Self-pay | Admitting: Obstetrics and Gynecology

## 2018-02-04 ENCOUNTER — Telehealth: Payer: Self-pay

## 2018-02-04 NOTE — Telephone Encounter (Signed)
Copied from CRM [phone]#[id number]. Topic: General - Other >> Feb 03, 2018  5:36 PM Debroah LoopLander, Lumin L wrote: Reason for CRM: Patient's son, Thayer OhmChris, calling to find out what she was billed [facility]. The pt is spanish speaking and would like a call back from him. She also needs her lab results.

## 2018-02-05 ENCOUNTER — Telehealth: Payer: Self-pay | Admitting: Emergency Medicine

## 2018-02-05 NOTE — Telephone Encounter (Signed)
This bill is both ridiculous and totally unacceptable.  I am suspecting some kind of billing error. There has to be something we can do to lower this bill to an acceptable much lower fee.  Patient should not be expected to pay this excessive and abusive amount. Let us look into this please. I spoke to the patient today.  Had I known this test was so expensive I would not have ordered it.  We need to try to help this patient.  Thanks

## 2018-02-05 NOTE — Telephone Encounter (Signed)
Pt came in with the [facility] bill wanting to know what the test was for $3130 and why the bill is so expensive as well as what the test was for. Patient is Spanish speaking only and brought her son to translate. Rose printed out the results for her from the letter that was sent to her. She would like a phone call from someone to explain the results. She will need Spanish speaking.   I informed her and son that she will have to call [facility] to discuss the pricing. Her son said that they have already called and they are supposed to call her back within 3 days.   Best callback number [phone]

## 2018-03-11 ENCOUNTER — Telehealth: Payer: Self-pay | Admitting: Emergency Medicine

## 2018-03-12 NOTE — Telephone Encounter (Signed)
Error

## 2018-04-21 NOTE — Telephone Encounter (Signed)
Pt called to speak with billing regarding the bill. Pts son on the phone to translate for pt. Tried transferring them to the billing dept, call went to voicemail. Provided pt with billing dept phone number and they stated they will call the number again tomorrow.

## 2018-05-07 NOTE — Progress Notes (Deleted)
NEUROLOGY FOLLOW UP OFFICE NOTE  Karen Gamble [id number]  HISTORY OF PRESENT ILLNESS: Karen Gamble is a 32 year old female who follows up for idiopathic intracranial hypertension.  A Spanish-speaking interpreter is present.  UPDATE: In May, she exhibited no papilledema on exam, so acetazolamide was discontinued.  I advised her to have her eyes rechecked in 3 months to evaluate for any recurrence of papilledema.  ***  HISTORY: She has had a headache since 2016.  It is right sided 5/10 pressure-like headache radiating from the back of her head to the front and behind the left eye.  She does have some neck pain.  It lasts all day and occurs 2 days a week.  Triggers include chocolate.  She usually takes Tylenol, which helps as well.  She sometimes has dizziness.  The headache is not positional.  She denies nausea, photophobia, phonophobia, visual obscurations, pulsatile tinnitus, unilateral weakness or numbness.  Sometimes, she sees a little black spot in the vision of her right eye that moves around, "like a spider".  It occurs with and without the headache.  She notices it if she is looking at something white.  It does not always occur but she notices it daily.  As per the optometrist's referral note, she had imaging to rule out an ocular foreign body, which was reportedly negative.  On the optometrist's exam, she was found to have right optic nerve head elevation and possible mild optic nerve edema.    She reports other neurologic symptoms as well.  She sometimes has episodes where she will suddenly drop objects from her right hand.  It occurs suddenly.  It feels like her arm is overworked but she had not performed any strenuous exercise.  There is no associated pain, numbness or weakness.  It occurs rarely.  She also reports memory difficulties.  She does not recall certain events such as a particular party or visit to a park.  Several times a week, she forgets why she  walked into a room.  She does report some stress.  Her mother recently passed away.  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  She often doesn't finish cleaning until late at night.    MRI of brain with and without contrast and MRA from 11/15/16 were unremarkable.  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  She deferred lumbar puncture for the time being but later had it performed on 06/14/17.  Opening pressure was 23 cm H2O.  CSF analysis was unremarkable with cell count 0, protein 40, glucose 60 and negative culture.  She subsequently developed a post-LP headache, requiring a blood patch.  Headaches are improved.  She has a dull right sided headache when she bends over.  Since the procedure, she has had a recurrence of left sided sciatic pain.  PAST MEDICAL HISTORY: Past Medical History:  Diagnosis Date  . Allergy   . Gestational diabetes    2nd pregnancy  . No pertinent past medical history   . Seasonal allergies     MEDICATIONS: Current Outpatient Medications on File Prior to Visit  Medication Sig Dispense Refill  . cyclobenzaprine (FLEXERIL) 5 MG tablet 1 pill by mouth up to every 8 hours as needed. Start with one pill by mouth each bedtime as needed due to sedation (Patient not taking: Reported on 01/21/2018) 15 tablet 0  . fexofenadine (ALLEGRA) 180 MG tablet  Take 180 mg by mouth daily.    . meloxicam (MOBIC) 7.5 MG tablet Take 1 tablet (7.5 mg total) by mouth daily. (Patient not taking: Reported on 01/21/2018) 30 tablet 0  . tiZANidine (ZANAFLEX) 2 MG tablet Take 1 tablet (2 mg total) by mouth at bedtime as needed for muscle spasms. (Patient not taking: Reported on 01/21/2018) 30 tablet 2  . vitamin E 400 UNIT capsule Take 400 Units by mouth daily. 2 caps daily     No current facility-administered medications on file prior to visit.     ALLERGIES: No Known Allergies  FAMILY  HISTORY: Family History  Problem Relation Age of Onset  . Diabetes Sister   . Diabetes Sister   . Diabetes Sister   . Diabetes Sister    SOCIAL HISTORY: Social History   Socioeconomic History  . Marital status: Married    Spouse name: Not on file  . Number of children: Not on file  . Years of education: Not on file  . Highest education level: Not on file  Occupational History  . Not on file  Social Needs  . Financial resource strain: Not on file  . Food insecurity:    Worry: Not on file    Inability: Not on file  . Transportation needs:    Medical: Not on file    Non-medical: Not on file  Tobacco Use  . Smoking status: Never Smoker  . Smokeless tobacco: Never Used  Substance and Sexual Activity  . Alcohol use: Yes    Comment: occassionally  . Drug use: No  . Sexual activity: Yes    Birth control/protection: Condom  Lifestyle  . Physical activity:    Days per week: Not on file    Minutes per session: Not on file  . Stress: Not on file  Relationships  . Social connections:    Talks on phone: Not on file    Gets together: Not on file    Attends religious service: Not on file    Active member of club or organization: Not on file    Attends meetings of clubs or organizations: Not on file    Relationship status: Not on file  . Intimate partner violence:    Fear of current or ex partner: Not on file    Emotionally abused: Not on file    Physically abused: Not on file    Forced sexual activity: Not on file  Other Topics Concern  . Not on file  Social History Narrative  . Not on file    REVIEW OF SYSTEMS: Constitutional: No fevers, chills, or sweats, no generalized fatigue, change in appetite Eyes: No visual changes, double vision, eye pain Ear, nose and throat: No hearing loss, ear pain, nasal congestion, sore throat Cardiovascular: No chest pain, palpitations Respiratory:  No shortness of breath at rest or with exertion, wheezes GastrointestinaI: No nausea,  vomiting, diarrhea, abdominal pain, fecal incontinence Genitourinary:  No dysuria, urinary retention or frequency Musculoskeletal:  No neck pain, back pain Integumentary: No rash, pruritus, skin lesions Neurological: as above Psychiatric: No depression, insomnia, anxiety Endocrine: No palpitations, fatigue, diaphoresis, mood swings, change in appetite, change in weight, increased thirst Hematologic/Lymphatic:  No purpura, petechiae. Allergic/Immunologic: no itchy/runny eyes, nasal congestion, recent allergic reactions, rashes  PHYSICAL EXAM: *** General: No acute distress.  Patient appears ***-groomed.  *** body habitus. Head:  Normocephalic/atraumatic Eyes:  Fundi examined but not visualized Neck: supple, no paraspinal tenderness, full range of motion Heart:  Regular rate and  rhythm Lungs:  Clear to auscultation bilaterally Back: No paraspinal tenderness Neurological Exam: alert and oriented to person, place, and time. Attention span and concentration intact, recent and remote memory intact, fund of knowledge intact.  Speech fluent and not dysarthric, language intact.  CN II-XII intact. Bulk and tone normal, muscle strength 5/5 throughout.  Sensation to light touch  intact.  Deep tendon reflexes 2+ throughout.  Finger to nose testing intact.  Gait normal, Romberg negative.  IMPRESSION: Idiopathic intracranial hypertension  PLAN: ***  Shon Millet, DO

## 2018-05-09 ENCOUNTER — Ambulatory Visit: Payer: Self-pay | Admitting: Neurology

## 2018-08-21 IMAGING — XA Imaging study
1 series · 1 of 1 positions shown · IV contrast (isovue)
Comparison: Lumbar puncture documentation films 06/14/2017.

CLINICAL DATA: Idiopathic intracranial hypertension. Post lumbar
puncture headache.

EXAM:
LUMBAR EPIDUROGRAM AND BLOOD PATCH
FLUOROSCOPY TIME:  14 seconds corresponding to a Dose Area Product
of 12.6 ?Gy*m2
TECHNIQUE: The skin entry site from previous lumbar puncture was localized
corresponding to the space between the L4 and L5 spinous processes..
An appropriate skin entry site was determined. Operator donned
sterile gloves and mask. Skin site was marked, prepped with
Betadine, and draped in usual sterile fashion, and infiltrated
locally with 1% lidocaine. The 20 gauge Crawford needle was advanced
into the lumbar posterior epidural space near the midline using a
LEFT interlaminar approach at L4-4using loss of resistance
technique. Diagnostic injection of 2mL Isovue-M 200 demonstrates
good epidural spread above and below the needle tip and crossing the
midline. No intravascular or subarachnoid component. 15 ml of
autologous blood were then administered as lumbar epidural blood
patch. No immediate complication.

[Series 1: ortho standard · 1 of 1 slices shown]
[im 1/1]
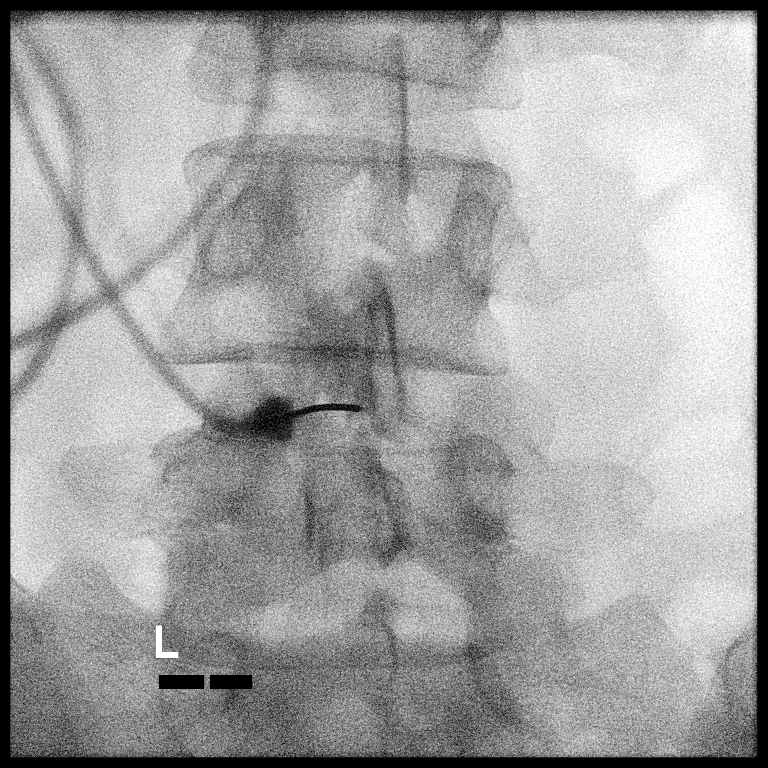

[1 of 1 positions shown; findings below may reference images not displayed]

IMPRESSION: 1. Technically successful lumbar epidural blood patch under
fluoroscopy. The patient was counseled on the importance of laying
flat for an additional 24 hours and maintaining good p.o. fluid
intake. The patient, through her interpreter, voiced understanding
this concept.

## 2018-10-31 ENCOUNTER — Other Ambulatory Visit: Payer: Self-pay | Admitting: Family Medicine

## 2018-10-31 DIAGNOSIS — R1030 Lower abdominal pain, unspecified: Secondary | ICD-10-CM

## 2019-05-01 ENCOUNTER — Other Ambulatory Visit: Payer: Self-pay

## 2019-05-01 DIAGNOSIS — Z20822 Contact with and (suspected) exposure to covid-19: Secondary | ICD-10-CM

## 2019-05-02 LAB — NOVEL CORONAVIRUS, NAA: SARS-CoV-2, NAA: NOT DETECTED

## 2019-12-10 ENCOUNTER — Ambulatory Visit: Payer: Self-pay | Admitting: Internal Medicine

## 2021-05-04 ENCOUNTER — Encounter: Payer: Self-pay | Admitting: Neurology

## 2021-05-17 ENCOUNTER — Other Ambulatory Visit: Payer: Self-pay

## 2021-05-17 ENCOUNTER — Emergency Department (HOSPITAL_COMMUNITY)
Admission: EM | Admit: 2021-05-17 | Discharge: 2021-05-17 | Disposition: A | Discharge status: Home / Self Care | Payer: Self-pay | Attending: Emergency Medicine | Admitting: Emergency Medicine

## 2021-05-17 DIAGNOSIS — F10920 Alcohol use, unspecified with intoxication, uncomplicated: Secondary | ICD-10-CM

## 2021-05-17 DIAGNOSIS — F10120 Alcohol abuse with intoxication, uncomplicated: Secondary | ICD-10-CM | POA: Insufficient documentation

## 2021-05-17 LAB — CBG MONITORING, ED: Glucose-Capillary: 107 mg/dL — ABNORMAL HIGH (ref 70–99)

## 2021-05-17 MED ORDER — SODIUM CHLORIDE 0.9 % IV BOLUS: 1000.0000 mL | Freq: Once | INTRAVENOUS | Status: DC

## 2021-05-17 NOTE — ED Triage Notes (Signed)
Pt alert and oriented, denies pain anywhere, is with [employer]

## 2021-05-17 NOTE — ED Provider Notes (Signed)
[facility]-EMERGENCY DEPT Provider Note   CSN: [id number] Arrival date & time: 05/17/21  2131     History Chief Complaint  Patient presents with   Near Syncope    Pt was found passed out between 2 cars, this was at a party where a lot of alcohol was being passed around.  Pt is now alert and apologizing profusely. Denies any pain    Karen Gamble is a 35 y.o. female.  35 year old female presents to the emergency department after passing out in a car.  Is accompanied by her family.  She was at a party today where she was drinking lots of Corona.  She denies any drug use.  She presently states that she feels fine.  She has no complaints of pain.  Specifically, she denies chest pain, back pain, abdominal pain.  No nausea or vomiting.  She received IV fluids with EMS.  Is requesting to leave the emergency department and go home to sleep.  The history is provided by the patient. No language interpreter was used.  Near Syncope      Past Medical History:  Diagnosis Date   Allergy    Gestational diabetes    2nd pregnancy   No pertinent past medical history    Seasonal allergies     Patient Active Problem List   Diagnosis Date Noted   History of idiopathic intracranial hypertension 01/21/2018   Spontaneous bruising 01/21/2018   Lower leg edema 01/21/2018   Generalized abdominal pain 01/21/2018   Cataract of left eye 07/18/2011    Past Surgical History:  Procedure Laterality Date   CHOLECYSTECTOMY       OB History     Gravida  4   Para  3   Term  3   Preterm  0   AB  1   Living  3      SAB  1   IAB  0   Ectopic  0   Multiple  0   Live Births  3           Family History  Problem Relation Age of Onset   Diabetes Sister    Diabetes Sister    Diabetes Sister    Diabetes Sister     Social History   Tobacco Use   Smoking status: Never   Smokeless tobacco: Never  Substance Use Topics   Alcohol use: Yes     Comment: occassionally   Drug use: No    Home Medications Prior to Admission medications   Medication Sig Start Date End Date Taking? Authorizing Provider  cyclobenzaprine (FLEXERIL) 5 MG tablet 1 pill by mouth up to every 8 hours as needed. Start with one pill by mouth each bedtime as needed due to sedation Patient not taking: Reported on 01/21/2018 03/29/16   Shade Flood, MD  fexofenadine (ALLEGRA) 180 MG tablet Take 180 mg by mouth daily.    [provider]  meloxicam (MOBIC) 7.5 MG tablet Take 1 tablet (7.5 mg total) by mouth daily. Patient not taking: Reported on 01/21/2018 03/29/16   Shade Flood, MD  tiZANidine (ZANAFLEX) 2 MG tablet Take 1 tablet (2 mg total) by mouth at bedtime as needed for muscle spasms. Patient not taking: Reported on 01/21/2018 10/08/17   Drema Dallas, DO  vitamin E 400 UNIT capsule Take 400 Units by mouth daily. 2 caps daily    [provider]    Allergies    Patient has no known  allergies.  Review of Systems   Review of Systems  Cardiovascular:  Positive for near-syncope.  Ten systems reviewed and are negative for acute change, except as noted in the HPI.    Physical Exam Updated Vital Signs BP (!) 138/92 (BP Location: Right Arm)   Pulse 89   Temp 98.2 F (36.8 C) (Oral)   Resp 18   Ht 5\' 4"  (1.626 m)   Wt 68.5 kg   SpO2 99%   BMI 25.92 kg/m   Physical Exam Vitals and nursing note reviewed.  Constitutional:      General: She is not in acute distress.    Appearance: She is well-developed. She is not diaphoretic.     Comments: Patient clinically intoxicated, but nontoxic.  HENT:     Head: Normocephalic and atraumatic.  Eyes:     General: No scleral icterus.    Conjunctiva/sclera: Conjunctivae normal.  Cardiovascular:     Rate and Rhythm: Regular rhythm. Tachycardia present.     Pulses: Normal pulses.  Pulmonary:     Effort: Pulmonary effort is normal. No respiratory distress.     Breath sounds: No stridor. No  wheezing or rales.     Comments: Lungs CTAB. Respirations even and unlabored. Musculoskeletal:        General: Normal range of motion.     Cervical back: Normal range of motion.     Comments: Abrasions to b/l knees  Skin:    General: Skin is warm and dry.     Coloration: Skin is not pale.     Findings: No erythema or rash.  Neurological:     Mental Status: She is alert and oriented to person, place, and time.     Comments: Ambulatory to bathroom with steady gait; minimal to no assistance.  Psychiatric:        Behavior: Behavior normal.    ED Results / Procedures / Treatments   Labs (all labs ordered are listed, but only abnormal results are displayed) Labs Reviewed  CBG MONITORING, ED - Abnormal; Notable for the following components:      Result Value   Glucose-Capillary 107 (*)    All other components within normal limits    EKG EKG Interpretation  Date/Time:  Wednesday May 17 2021 21:53:21 EDT Ventricular Rate:  110 PR Interval:  179 QRS Duration: 91 QT Interval:  356 QTC Calculation: 480 R Axis:   70 Text Interpretation: Sinus tachycardia Low voltage, precordial leads ST elev, probable normal early repol pattern Borderline prolonged QT interval When compared to prior, more artifact but otherwise similar appearance. No STEMI Confirmed by 01-06-1990 (Theda Belfast) on 05/17/2021 10:52:05 PM  Radiology No results found.  Procedures Procedures   Medications Ordered in ED Medications - No data to display   ED Course  I have reviewed the triage vital signs and the nursing notes.  Pertinent labs & imaging results that were available during my care of the patient were reviewed by me and considered in my medical decision making (see chart for details).    MDM Rules/Calculators/A&P                           35 year old female presents to the emergency department after passing out in a vehicle after drinking at a party during the day.  Unclear how many coronas the  patient consumed prior to arrival.  She is clinically intoxicated, but able to ambulate independently.  She was given 1 L IV  fluids which were initiated by EMS.  Hemodynamically stable in the emergency department.  She feels well and has no complaints.  She is requesting to go home.  Do not feel further sobering in the emergency department is warranted.  Discharged in the care of a sober family member.  Departed the ED in stable condition.   Final Clinical Impression(s) / ED Diagnoses Final diagnoses:  Alcoholic intoxication without complication [facility])    Rx / DC Orders ED Discharge Orders     None        Antony Madura, PA-C 05/18/21 0018    Tegeler, Canary Brim, MD 05/18/21 [phone]

## 2021-05-17 NOTE — Discharge Instructions (Addendum)
We believe that your symptoms were related to acute alcohol intoxication.  Return to the ED for any new or concerning symptoms.

## 2021-07-31 NOTE — Progress Notes (Signed)
NEUROLOGY CONSULTATION NOTE  Sheralee Qazi MRN: [id number] DOB: December 21, 1985  Referring provider: Herschel Senegal, OD Primary care provider: No PCP  Reason for consult:  idiopathic intracranial hypertension  Assessment/Plan:   History of idiopathic intracranial hypertension Headache, more consistent with cervicogenic headache rather than secondary to increased intracranial pressure.  Start acetazolamide 500mg  twice daily.  Check BMP. Start cyclobenzaprine 10mg  at bedtime Follow up with optometrist in 6-8 weeks and adjust dose of acetazolamide accordingly based on exam. Follow up with me in 6-7 months.    Subjective:  Karen Gamble is a 35 year old Spanish-speaking female who presents for idiopathic intracranial hypertension.  A Spanish-speaking interpretor via phone present to translate (ID Florina Ou)  HISTORY: She has had a headache since 2016.  It is right sided 5/10 pressure-like headache radiating from the back of her head to the front and behind the left eye.  She does have some neck pain.  It lasts all day and occurs 2 days a week.  Triggers include chocolate.  She usually takes Tylenol, which helps as well.  She sometimes has dizziness.  The headache is not positional.  She denies nausea, photophobia, phonophobia, visual obscurations, pulsatile tinnitus, unilateral weakness or numbness.   Sometimes, she sees a little black spot in the vision of her right eye that moves around, "like a spider".  It occurs with and without the headache.  She notices it if she is looking at something white.  It does not always occur but she notices it daily.   In 2018, she was found to have right optic nerve head elevation and possible mild optic nerve edema on eye exam.  MRI brain wwo on 11/15/2016 was unremarkable.  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  MRA was unremarkable.   Opening pressure of LP on 06/14/2017 demonstrated opening pressure of 23 cm water.  She was started on acetazolamide.  Repeat eye exam in May 2019 revealed resolution of papilledema and acetazolamide was discontinued.  She started having the habitual right sided headaches again in September 2022.  They are occurring 1 to 2 a week.  She had a repeat eye exam in September, which revealed slight optic nerve head elevation in both eyes and possible slight optic nerve head edema in left eye.      PAST MEDICAL HISTORY: Past Medical History:  Diagnosis Date   Allergy    Gestational diabetes    2nd pregnancy   No pertinent past medical history    Seasonal allergies     PAST SURGICAL HISTORY: Past Surgical History:  Procedure Laterality Date   CHOLECYSTECTOMY      MEDICATIONS: Current Outpatient Medications on File Prior to Visit  Medication Sig Dispense Refill   cyclobenzaprine (FLEXERIL) 5 MG tablet 1 pill by mouth up to every 8 hours as needed. Start with one pill by mouth each bedtime as needed due to sedation (Patient not taking: Reported on 01/21/2018) 15 tablet 0   fexofenadine (ALLEGRA) 180 MG tablet Take 180 mg by mouth daily.     meloxicam (MOBIC) 7.5 MG tablet Take 1 tablet (7.5 mg total) by mouth daily. (Patient not taking: Reported on 01/21/2018) 30 tablet 0   tiZANidine (ZANAFLEX) 2 MG tablet Take 1 tablet (2 mg total) by mouth at bedtime as needed for muscle spasms. (Patient not taking: Reported on 01/21/2018) 30 tablet 2   vitamin E 400 UNIT capsule Take  400 Units by mouth daily. 2 caps daily     No current facility-administered medications on file prior to visit.    ALLERGIES: No Known Allergies  FAMILY HISTORY: Family History  Problem Relation Age of Onset   Diabetes Sister    Diabetes Sister    Diabetes Sister    Diabetes Sister     Objective:  Blood pressure 125/81, pulse 70, height 5\' 1"  (1.549 m), weight 150 lb 12.8 oz (68.4 kg), SpO2 98 %. General: No acute  distress.  Patient appears well-groomed.   Head:  Normocephalic/atraumatic Eyes:  fundi examined but not visualized Neck: supple, right upper paraspinal tenderness, full range of motion Heart: regular rate and rhythm Lungs: Clear to auscultation bilaterally. Vascular: No carotid bruits. Neurological Exam: Mental status: alert and oriented to person, place, and time, recent and remote memory intact, fund of knowledge intact, attention and concentration intact, speech fluent and not dysarthric, language intact. Cranial nerves: CN I: not tested CN II: pupils equal, round and reactive to light, visual fields intact CN III, IV, VI:  full range of motion, no nystagmus, no ptosis CN V: facial sensation intact. CN VII: upper and lower face symmetric CN VIII: hearing intact CN IX, X: gag intact, uvula midline CN XI: sternocleidomastoid and trapezius muscles intact CN XII: tongue midline Bulk & Tone: normal, no fasciculations. Motor:  muscle strength 5/5 throughout Sensation:  Pinprick, temperature and vibratory sensation intact. Deep Tendon Reflexes:  2+ throughout,  toes downgoing.   Finger to nose testing:  Without dysmetria.   Heel to shin:  Without dysmetria.   Gait:  Normal station and stride.  Romberg negative.    Thank you for allowing me to take part in the care of this patient.  , DO  CC: Shon Millet, OD

## 2021-08-01 ENCOUNTER — Ambulatory Visit (INDEPENDENT_AMBULATORY_CARE_PROVIDER_SITE_OTHER): Payer: Self-pay | Admitting: Neurology

## 2021-08-01 ENCOUNTER — Encounter: Payer: Self-pay | Admitting: Neurology

## 2021-08-01 ENCOUNTER — Other Ambulatory Visit: Payer: Self-pay

## 2021-08-01 ENCOUNTER — Other Ambulatory Visit (INDEPENDENT_AMBULATORY_CARE_PROVIDER_SITE_OTHER): Payer: Self-pay

## 2021-08-01 VITALS — BP 125/81 | HR 70 | Ht 61.0 in | Wt 150.8 lb

## 2021-08-01 DIAGNOSIS — Z79899 Other long term (current) drug therapy: Secondary | ICD-10-CM

## 2021-08-01 DIAGNOSIS — G932 Benign intracranial hypertension: Secondary | ICD-10-CM

## 2021-08-01 DIAGNOSIS — G4486 Cervicogenic headache: Secondary | ICD-10-CM

## 2021-08-01 LAB — BASIC METABOLIC PANEL
BUN: 14 mg/dL (ref 6–23)
CO2: 26 mEq/L (ref 19–32)
Calcium: 9.8 mg/dL (ref 8.4–10.5)
Chloride: 102 mEq/L (ref 96–112)
Creatinine, Ser: 0.75 mg/dL (ref 0.40–1.20)
GFR: 103.07 mL/min (ref >60.00)
Glucose, Bld: 73 mg/dL (ref 70–99)
Potassium: 4.7 mEq/L (ref 3.5–5.1)
Sodium: 136 mEq/L (ref 135–145)

## 2021-08-01 MED ORDER — ACETAZOLAMIDE 250 MG PO TABS
250.0000 mg | ORAL_TABLET | Freq: Two times a day (BID) | ORAL | 6 refills | Status: DC
Start: 2021-08-01 — End: 2022-02-23

## 2021-08-01 MED ORDER — CYCLOBENZAPRINE HCL 10 MG PO TABS: 10.0000 mg | ORAL_TABLET | Freq: Every day | ORAL | 6 refills | Status: DC

## 2021-08-01 NOTE — Patient Instructions (Addendum)
Start acetazolamide 250mg , 1 tablet twice daily.  Have a repeat eye exam with the eye doctor, Dr. , in 6 to 8 weeks.  Have him send his note to me and we can adjust dose if needed. To help with headache and neck pain, take cyclobenzaprine 10mg  at bedtime.  Caution for drowsiness I want to check a lab, a basic metabolic panel Follow up in 6-7 months.   1. Comience con acetazolamida de 250 mg, 1 tableta dos veces al da. Hgase un examen de la vista repetido con el oftalmlogo, el Dr. Francesca Oman, en 6 a 8 semanas. Pdale que me enve su nota y podemos ajustar la dosis si es necesario. 2. Para ayudar con el dolor de cabeza y de cuello, tome 10 mg de ciclobenzaprina a la hora de [place]. [place] para la somnolencia 3.  Quiero revisar un laboratorio, un panel metablico bsico 4. Seguimiento en 6-7 meses.

## 2022-02-22 NOTE — Progress Notes (Signed)
NEUROLOGY FOLLOW UP OFFICE NOTE  [name] [id number]  Assessment/Plan:   History of idiopathic intracranial hypertension Cervicogenic headache    Continue acetazolamide 500mg  twice daily.   Advised to have repeat eye exam and request notes sent to me. Start cyclobenzaprine 10mg  every night at bedtime.  If she cannot tolerate or if not effective in 4 weeks, she will contact and would plan to switch to gabapentin Follow up with optometrist in 6-8 weeks and adjust dose of acetazolamide accordingly based on exam. Follow up with me in 4 months.       Subjective:  Karen Gamble is a 36 year old Spanish-speaking female who follows up for IIH and headache.  She is accompanied by an interpreter  UPDATE: Current medications:  acetazolamide 500mg  BID, cyclobenzaprine 10mg  QHS (not every night because it makes her drowsy)  Started acetazolamide in December.  Advised to follow up with optometrist.  She said she saw them about a month after the visit but I have never received the report.  Reportedly, there was no change.    Notes improvement in headache, particularly with the eye pain.  Still has occipital headache going down the shoulders.     HISTORY: She has had a headache since 2016.  It is right sided 5/10 pressure-like headache radiating from the back of her head to the front and behind the left eye.  Also numb sensation involving the entire head, particularly top of head.  She does have some neck pain.  It lasts all day and occurs 2 days a week.  Triggers include chocolate.  She usually takes Tylenol, which helps as well.  She sometimes has dizziness.  The headache is not positional.  She denies nausea, photophobia, phonophobia, visual obscurations, pulsatile tinnitus, unilateral weakness or numbness.   Sometimes, she sees a little black spot in the vision of her right eye that moves around, "like a spider".  It occurs with and without the headache.  She notices  it if she is looking at something white.  It does not always occur but she notices it daily.   In 2018, she was found to have right optic nerve head elevation and possible mild optic nerve edema on eye exam.  MRI brain wwo on 11/15/2016 was unremarkable.  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  MRA was unremarkable.  Opening pressure of LP on 06/14/2017 demonstrated opening pressure of 23 cm water.  She was started on acetazolamide.  Repeat eye exam in May 2019 revealed resolution of papilledema and acetazolamide was discontinued.  She started having the habitual right sided headaches again in September 2022.  They are occurring 1 to 2 a week.  She had a repeat eye exam in September, which revealed slight optic nerve head elevation in both eyes and possible slight optic nerve head edema in left eye.     Past medications:  tizanidine, meloxicam  PAST MEDICAL HISTORY: Past Medical History:  Diagnosis Date   Allergy    Gestational diabetes    2nd pregnancy   No pertinent past medical history    Seasonal allergies     MEDICATIONS: Current Outpatient Medications on File Prior to Visit  Medication Sig Dispense Refill   acetaZOLAMIDE (DIAMOX) 250 MG tablet Take 1 tablet (250 mg total) by mouth 2 (two) times daily. 60 tablet 6   cyclobenzaprine (FLEXERIL) 10 MG tablet Take 1 tablet (10  mg total) by mouth at bedtime. 30 tablet 6   fexofenadine (ALLEGRA) 180 MG tablet Take 180 mg by mouth daily.     meloxicam (MOBIC) 7.5 MG tablet Take 1 tablet (7.5 mg total) by mouth daily. (Patient not taking: Reported on 01/21/2018) 30 tablet 0   tiZANidine (ZANAFLEX) 2 MG tablet Take 1 tablet (2 mg total) by mouth at bedtime as needed for muscle spasms. (Patient not taking: Reported on 01/21/2018) 30 tablet 2   vitamin E 400 UNIT capsule Take 400 Units by mouth daily. 2 caps daily     No current  facility-administered medications on file prior to visit.    ALLERGIES: No Known Allergies  FAMILY HISTORY: Family History  Problem Relation Age of Onset   Diabetes Sister    Diabetes Sister    Diabetes Sister    Diabetes Sister       Objective:  Blood pressure 122/85, pulse 68, height 5' (1.524 m), weight 140 lb 12.8 oz (63.9 kg), SpO2 99 %. General: No acute distress.  Patient appears well-groomed.   Head:  Normocephalic/atraumatic Eyes:  Fundi examined but not visualized Neck: supple, bilateral suboccipital and upper cervical paraspinal tenderness, full range of motion Heart:  Regular rate and rhythm Neurological Exam: alert and oriented to person, place, and time.  Speech fluent and not dysarthric, language intact.  CN II-XII intact. Bulk and tone normal, muscle strength 5/5 throughout.  Sensation to light touch intact.  Deep tendon reflexes 2+ throughout, toes downgoing.  Finger to nose testing intact.  Gait normal, Romberg negative.   Shon Millet, DO

## 2022-02-23 ENCOUNTER — Ambulatory Visit (INDEPENDENT_AMBULATORY_CARE_PROVIDER_SITE_OTHER): Payer: Self-pay | Admitting: Neurology

## 2022-02-23 ENCOUNTER — Encounter: Payer: Self-pay | Admitting: Neurology

## 2022-02-23 VITALS — BP 122/85 | HR 68 | Ht 60.0 in | Wt 140.8 lb

## 2022-02-23 DIAGNOSIS — G932 Benign intracranial hypertension: Secondary | ICD-10-CM

## 2022-02-23 DIAGNOSIS — G4486 Cervicogenic headache: Secondary | ICD-10-CM

## 2022-02-23 MED ORDER — CYCLOBENZAPRINE HCL 10 MG PO TABS: 10.0000 mg | ORAL_TABLET | Freq: Every day | ORAL | 6 refills | Status: AC

## 2022-02-23 MED ORDER — ACETAZOLAMIDE 250 MG PO TABS: 250.0000 mg | ORAL_TABLET | Freq: Two times a day (BID) | ORAL | 6 refills | Status: AC

## 2022-02-23 NOTE — Patient Instructions (Addendum)
Refilled acetazolamide 500mg  twice daily.  Will need repeat eye exam.   [facility] Open Now - Closes at 5:00 PM phone [phone] [address] [place], [place]  Take cyclobenzaprine 10mg  at bedtime.  If no improvement in 4 weeks, contact me Follow up 4 months.    1. Acetazolamida rellenada 500 mg dos veces al da. Necesitar repetir el examen de la vista. [facility] Abierto ahora - Cierra a las 5:00 p. m. telfono [phone] [address] [place]. [address] [place], [place]  2. Tome 10 mg de ciclobenzaprina antes de [place]. Si no mejora en 4 semanas, contcteme 3. Seguimiento 4 meses.

## 2022-06-20 NOTE — Progress Notes (Signed)
NEUROLOGY FOLLOW UP OFFICE NOTE  Karen Gamble [id number]  Assessment/Plan:   Idiopathic intracranial hypertension Headaches - she has episodic headaches that likely are a separate issue - migraine vs cervicogenic   Continue acetazolamide 250mg  twice daily.   If eye exam shows evidence of papilledema, will increase dose to 500mg  twice daily.  If eye exam looks okay, consider preventative medication for headaches. Cyclobenzaprine 10mg  every night at bedtime.   Use Aleve, Tylenol or Motrin no more than 2 days out of week to prevent rebound headache. Advised to follow up with PCP regarding back pain/concern for kidney stones Follow up with me in 4 to 5 months.       Subjective:  Karen Gamble is a 36 year old Spanish-speaking female who follows up for IIH and headache.  She is accompanied by an interpreter   UPDATE: Current medications:  acetazolamide 250mg  BID, cyclobenzaprine 10mg  QHS (not every night because it makes her drowsy), Tylenol, Motrin, [place]   She has had a headache 3 days out of the last week for the last 2 weeks, which she blames on weather.  Typically they occur 5-6 times a month.  May take Tylenol, Motrin or Aleve (works best), lasting 20-30 minutes.  She wasn't able to get an eye appointment for another 3 weeks.  She repots non-radiating back pain from the lower back bilaterally up to between shoulder blades.  Concerned about kidney stones.   HISTORY: She has had a headache since 2016.  It is right sided 5/10 pressure-like headache radiating from the back of her head to the front and behind the left eye.  Also numb sensation involving the entire head, particularly top of head.  She does have some neck pain.  It lasts all day and occurs 2 days a week.  Triggers include chocolate.  She usually takes Tylenol, which helps as well.  She sometimes has dizziness.  The headache is not positional.  She denies nausea, photophobia, phonophobia, visual  obscurations, pulsatile tinnitus, unilateral weakness or numbness.   Sometimes, she sees a little black spot in the vision of her right eye that moves around, "like a spider".  It occurs with and without the headache.  She notices it if she is looking at something white.  It does not always occur but she notices it daily.   In 2018, she was found to have right optic nerve head elevation and possible mild optic nerve edema on eye exam.  MRI brain wwo on 11/15/2016 was unremarkable.  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  MRA was unremarkable.  Opening pressure of LP on 06/14/2017 demonstrated opening pressure of 23 cm water.  She was started on acetazolamide.  Repeat eye exam in May 2019 revealed resolution of papilledema and acetazolamide was discontinued.  She started having the habitual right sided headaches again in September 2022.  They are occurring 1 to 2 a week.  She had a repeat eye exam in September, which revealed slight optic nerve head elevation in both eyes and possible slight optic nerve head edema in left eye.     Past medications:  tizanidine, meloxicam  PAST MEDICAL HISTORY: Past Medical History:  Diagnosis Date   Allergy    Gestational diabetes    2nd pregnancy   No pertinent past medical history    Seasonal allergies     MEDICATIONS: Current Outpatient Medications on File Prior to  Visit  Medication Sig Dispense Refill   acetaZOLAMIDE (DIAMOX) 250 MG tablet Take 1 tablet (250 mg total) by mouth 2 (two) times daily. 60 tablet 6   Biotin 1 MG CAPS Take by mouth.     cyclobenzaprine (FLEXERIL) 10 MG tablet Take 1 tablet (10 mg total) by mouth at bedtime. 30 tablet 6   fexofenadine (ALLEGRA) 180 MG tablet Take 180 mg by mouth daily. (Patient not taking: Reported on 02/23/2022)     vitamin E 400 UNIT capsule Take 400 Units by mouth daily. 2 caps daily     No current  facility-administered medications on file prior to visit.    ALLERGIES: No Known Allergies  FAMILY HISTORY: Family History  Problem Relation Age of Onset   Diabetes Sister    Diabetes Sister    Diabetes Sister    Diabetes Sister       Objective:  Blood pressure (!) 100/50, pulse 78, height 5' (1.524 m), weight 141 lb 9.6 oz (64.2 kg), SpO2 96 %. General: No acute distress.  Patient appears well-groomed.   Head:  Normocephalic/atraumatic Eyes:  Fundi examined but not visualized Neck: supple, no paraspinal tenderness, full range of motion Heart:  Regular rate and rhythm Neurological Exam: alert and oriented to person, place, and time.  Speech fluent and not dysarthric, language intact.  CN II-XII intact. Bulk and tone normal, muscle strength 5/5 throughout.  Sensation to light touch intact.  Deep tendon reflexes 2+ throughout.  Finger to nose testing intact.  Gait normal, Romberg negative.   Metta Clines, DO

## 2022-06-25 ENCOUNTER — Ambulatory Visit (INDEPENDENT_AMBULATORY_CARE_PROVIDER_SITE_OTHER): Payer: Self-pay | Admitting: Neurology

## 2022-06-25 ENCOUNTER — Encounter: Payer: Self-pay | Admitting: Neurology

## 2022-06-25 VITALS — BP 100/50 | HR 78 | Ht 60.0 in | Wt 141.6 lb

## 2022-06-25 DIAGNOSIS — R519 Headache, unspecified: Secondary | ICD-10-CM

## 2022-06-25 DIAGNOSIS — G932 Benign intracranial hypertension: Secondary | ICD-10-CM

## 2022-06-25 NOTE — Patient Instructions (Signed)
Continue acetazolamide 250mg  twice daily.  Contact me when you have the eye exam.  We will make changes in management based on eye exam findings. Limit use of pain relievers (Tylenol, Aleve, Motrin) to no more than 2 days out of week to prevent risk of rebound or medication-overuse headache. Follow up with your PCP regarding back pain Follow up with me in 4 to 5 months.    1. Contine con 250 mg de [place]. Contctame cuando tengas el examen de la vista. Haremos cambios en la gestin segn los [employer] del examen de la vista. 2. Limite el uso de analgsicos (Tylenol, Aleve, Motrin) a no ms de 2 das por semana para evitar el riesgo de rebote o dolor de cabeza por uso excesivo de medicamentos. 3. Haga un seguimiento con su PCP sobre el dolor de espalda. 4. Haz un seguimiento conmigo en 4 a 5 meses.

## 2022-10-29 NOTE — Progress Notes (Deleted)
NEUROLOGY FOLLOW UP OFFICE NOTE  Karen Gamble [id number]  Assessment/Plan:   Idiopathic intracranial hypertension Headaches - she has episodic headaches that likely are a separate issue - migraine vs cervicogenic   Continue acetazolamide '250mg'$  twice daily.   If eye exam shows evidence of papilledema, will increase dose to '500mg'$  twice daily.  If eye exam looks okay, consider preventative medication for headaches. *** Cyclobenzaprine '10mg'$  every night at bedtime.   *** Use Aleve, Tylenol or Motrin no more than 2 days out of week to prevent rebound headache. *** Follow up with me in 4 to 5 months. ***       Subjective:  Karen Gamble is a 37 year old Spanish-speaking female who follows up for IIH and headache.  She is accompanied by an interpreter   UPDATE: Current medications:  acetazolamide '250mg'$  BID, cyclobenzaprine '10mg'$  QHS (not every night because it makes her drowsy), Tylenol, Motrin, Alevel   ***   HISTORY: She has had a headache since 2016.  It is right sided 5/10 pressure-like headache radiating from the back of her head to the front and behind the left eye.  Also numb sensation involving the entire head, particularly top of head.  She does have some neck pain.  It lasts all day and occurs 2 days a week.  Triggers include chocolate.  She usually takes Tylenol, which helps as well.  She sometimes has dizziness.  The headache is not positional.  She denies nausea, photophobia, phonophobia, visual obscurations, pulsatile tinnitus, unilateral weakness or numbness.   Sometimes, she sees a little black spot in the vision of her right eye that moves around, "like a spider".  It occurs with and without the headache.  She notices it if she is looking at something white.  It does not always occur but she notices it daily.   In 2018, she was found to have right optic nerve head elevation and possible mild optic nerve edema on eye exam.  MRI brain wwo on 11/15/2016 was  unremarkable.  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  MRA was unremarkable.  Opening pressure of LP on 06/14/2017 demonstrated opening pressure of 23 cm water.  She was started on acetazolamide.  Repeat eye exam in May 2019 revealed resolution of papilledema and acetazolamide was discontinued.  She started having the habitual right sided headaches again in September 2022.  They are occurring 1 to 2 a week.  She had a repeat eye exam in September, which revealed slight optic nerve head elevation in both eyes and possible slight optic nerve head edema in left eye.     Past medications:  tizanidine, meloxicam  PAST MEDICAL HISTORY: Past Medical History:  Diagnosis Date   Allergy    Gestational diabetes    2nd pregnancy   No pertinent past medical history    Seasonal allergies     MEDICATIONS: Current Outpatient Medications on File Prior to Visit  Medication Sig Dispense Refill   acetaZOLAMIDE (DIAMOX) 250 MG tablet Take 1 tablet (250 mg total) by mouth 2 (two) times daily. 60 tablet 6   Biotin 1 MG CAPS Take by mouth.     cyclobenzaprine (FLEXERIL) 10 MG tablet Take 1 tablet (10 mg total) by mouth at bedtime. 30 tablet 6   fexofenadine (ALLEGRA) 180 MG tablet Take 180 mg by mouth daily. (Patient not taking: Reported on 02/23/2022)     potassium  phosphate, monobasic, (K-PHOS ORIGINAL) 500 MG tablet Take 500 mg by mouth every 6 (six) months.     vitamin E 400 UNIT capsule Take 400 Units by mouth daily. 2 caps daily     No current facility-administered medications on file prior to visit.    ALLERGIES: No Known Allergies  FAMILY HISTORY: Family History  Problem Relation Age of Onset   Diabetes Sister    Diabetes Sister    Diabetes Sister    Diabetes Sister       Objective:  *** General: No acute distress.  Patient appears well-groomed.   Head:  Normocephalic/atraumatic Eyes:   Fundi examined but not visualized Neck: supple, no paraspinal tenderness, full range of motion Heart:  Regular rate and rhythm Neurological Exam: ***   Metta Clines, DO

## 2022-10-30 ENCOUNTER — Encounter: Payer: Self-pay | Admitting: Neurology

## 2022-10-30 ENCOUNTER — Ambulatory Visit: Payer: Self-pay | Admitting: Neurology
